# Patient Record
Sex: Male | Born: 1975
Health system: Southern US, Community
[De-identification: ages and names within clinical notes are randomized; demographics above are authoritative.]

## PROBLEM LIST (undated history)

## (undated) DIAGNOSIS — R569 Unspecified convulsions: Secondary | ICD-10-CM

## (undated) DIAGNOSIS — I10 Essential (primary) hypertension: Secondary | ICD-10-CM

## (undated) DIAGNOSIS — S069X9A Unspecified intracranial injury with loss of consciousness of unspecified duration, initial encounter: Secondary | ICD-10-CM

## (undated) HISTORY — PX: BRAIN SURGERY: SHX531

## (undated) HISTORY — DX: Essential (primary) hypertension: I10

---

## 1995-04-20 DIAGNOSIS — S069XAA Unspecified intracranial injury with loss of consciousness status unknown, initial encounter: Secondary | ICD-10-CM

## 1995-04-20 DIAGNOSIS — S069X9A Unspecified intracranial injury with loss of consciousness of unspecified duration, initial encounter: Secondary | ICD-10-CM

## 1995-04-20 HISTORY — DX: Unspecified intracranial injury with loss of consciousness status unknown, initial encounter: S06.9XAA

## 1995-04-20 HISTORY — DX: Unspecified intracranial injury with loss of consciousness of unspecified duration, initial encounter: S06.9X9A

## 1999-05-11 ENCOUNTER — Encounter: Payer: Self-pay | Admitting: Emergency Medicine

## 1999-05-11 ENCOUNTER — Emergency Department (HOSPITAL_COMMUNITY): Admission: EM | Admit: 1999-05-11 | Discharge: 1999-05-11 | Payer: Self-pay | Admitting: Emergency Medicine

## 1999-05-17 ENCOUNTER — Encounter: Payer: Self-pay | Admitting: Emergency Medicine

## 1999-05-17 ENCOUNTER — Emergency Department (HOSPITAL_COMMUNITY): Admission: EM | Admit: 1999-05-17 | Discharge: 1999-05-17 | Payer: Self-pay | Admitting: Emergency Medicine

## 2001-04-24 ENCOUNTER — Emergency Department (HOSPITAL_COMMUNITY): Admission: EM | Admit: 2001-04-24 | Discharge: 2001-04-24 | Payer: Self-pay | Admitting: Emergency Medicine

## 2001-05-09 ENCOUNTER — Emergency Department (HOSPITAL_COMMUNITY): Admission: EM | Admit: 2001-05-09 | Discharge: 2001-05-09 | Payer: Self-pay | Admitting: Emergency Medicine

## 2005-05-19 ENCOUNTER — Emergency Department (HOSPITAL_COMMUNITY): Admission: EM | Admit: 2005-05-19 | Discharge: 2005-05-19 | Payer: Self-pay | Admitting: Emergency Medicine

## 2006-04-13 ENCOUNTER — Inpatient Hospital Stay (HOSPITAL_COMMUNITY): Admission: AC | Admit: 2006-04-13 | Discharge: 2006-04-14 | Payer: Self-pay

## 2006-06-20 ENCOUNTER — Ambulatory Visit: Payer: Self-pay | Admitting: Family Medicine

## 2006-06-23 ENCOUNTER — Ambulatory Visit (HOSPITAL_COMMUNITY): Admission: RE | Admit: 2006-06-23 | Discharge: 2006-06-23 | Payer: Self-pay | Admitting: Internal Medicine

## 2006-08-18 ENCOUNTER — Ambulatory Visit (HOSPITAL_BASED_OUTPATIENT_CLINIC_OR_DEPARTMENT_OTHER): Admission: RE | Admit: 2006-08-18 | Discharge: 2006-08-18 | Payer: Self-pay | Admitting: General Surgery

## 2006-09-11 ENCOUNTER — Emergency Department (HOSPITAL_COMMUNITY): Admission: EM | Admit: 2006-09-11 | Discharge: 2006-09-12 | Payer: Self-pay | Admitting: Emergency Medicine

## 2006-10-29 ENCOUNTER — Emergency Department (HOSPITAL_COMMUNITY): Admission: EM | Admit: 2006-10-29 | Discharge: 2006-10-29 | Payer: Self-pay | Admitting: Emergency Medicine

## 2007-02-06 DIAGNOSIS — K4090 Unilateral inguinal hernia, without obstruction or gangrene, not specified as recurrent: Secondary | ICD-10-CM | POA: Insufficient documentation

## 2007-02-06 DIAGNOSIS — Z9889 Other specified postprocedural states: Secondary | ICD-10-CM | POA: Insufficient documentation

## 2008-06-13 ENCOUNTER — Emergency Department (HOSPITAL_COMMUNITY): Admission: EM | Admit: 2008-06-13 | Discharge: 2008-06-13 | Payer: Self-pay | Admitting: Family Medicine

## 2010-09-04 NOTE — Op Note (Signed)
Michael Oneill, Michael Oneill            ACCOUNT NO.:  [id number]   MEDICAL RECORD NO.:  [id number]          PATIENT TYPE:  AMB   LOCATION:  DSC                          FACILITY:  [facility]   PHYSICIAN:  Cherylynn Ridges, M.D.    DATE OF BIRTH:  1975/09/09   DATE OF PROCEDURE:  DATE OF DISCHARGE:                               OPERATIVE REPORT   PREOPERATIVE DIAGNOSIS:  Right inguinal hernia.   POSTOPERATIVE DIAGNOSIS:  Large right indirect inguinal hernia   PROCEDURE:  Right inguinal hernia repair with mesh.   SURGEON:  Cherylynn Ridges, M.D.   ASSISTANT:  None.   ANESTHESIA:  General with a laryngeal airway.   ESTIMATED BLOOD LOSS:  50 mL.   COMPLICATIONS:  None.   CONDITION:  Stable.   FINDINGS:  The patient had a large indirect sac associated with the  spermatic cord.  The cord was intact along with its blood some.  He had  a very small direct component.   INDICATIONS FOR OPERATION:  The patient is a 35 year old with a  symptomatic large right inguinal hernia.   OPERATION:  The patient was taken to the operating room, placed on the  table in supine position.  After an adequate general laryngeal airway  anesthetic was administered, he was prepped and draped in the usual  sterile manner exposing the right groin area.   A right transverse curvilinear incision was made at the level of the  superficial ring.  We took it down to and through the subcutaneous  tissue and then through Scarpa fascia down to the external oblique  fascia.  We dissected out the fascia, then the external oblique fascia  along its fibers down through the superficial ring.  We dissected the  spermatic cord out along with a large hernia sac at the pubic tubercle.  We isolated it up on a work bench with a Penrose drain and then  dissected away this large indirect sac away from the spermatic cord.  During this process because of the long-term process of this hernia,  there was a lot of vasculature to the sac  connected to the cord and as  we dissected this away, there was some bleeding of the cord structures.  Care was taken not to injure the vas deferens or its blood supply.   We dissected away the cord from the sac.  We twisted the sac and then  put a suture ligature x2 at its base and allowed to it retract after  resecting the excess sac.  We then repaired the floor where there was a  small indirect component by imbricating it on itself with a three  interrupted stitches of 0 Ethibond.  Ethibond 0 was also used to tie  off and ligate the sac.  We then sutured in an oval piece of mesh  measuring approximately 5 x 3 cm the size from the pubic tubercle up to  the internal ring using a running 0 Prolene, attaching mesh to the  anterior medial aspect of the conjoined tendon and inferolaterally to  the reflected portion of the inguinal ligament.  We tied  it down.  All  counts were correct.  The spermatic cord was allowed to fall back into  the canal.  We reapproximated the external oblique fascia on top of the  spermatic cord using a running 3-0 Vicryl suture.  We irrigated with  antibiotic solution.  We soaked the mesh in this solution also.  We  reapproximated Scarpa fascia using interrupted 3-0 Vicryl sutures and  the skin was closed using running subcuticular stitch of 4-0 Vicryl.  All needle, sponge counts and instrument counts were correct.  A sterile  dressing was applied.      Cherylynn Ridges, M.D.  Electronically Signed     JOW/MEDQ  D:  08/18/2006  T:  08/18/2006  Job:  [id number]

## 2013-03-01 ENCOUNTER — Encounter (HOSPITAL_COMMUNITY): Payer: Self-pay | Admitting: Emergency Medicine

## 2013-03-01 ENCOUNTER — Emergency Department (HOSPITAL_COMMUNITY)
Admission: EM | Admit: 2013-03-01 | Discharge: 2013-03-02 | Disposition: A | Discharge status: Home / Self Care | Payer: Self-pay | Attending: Emergency Medicine | Admitting: Emergency Medicine

## 2013-03-01 ENCOUNTER — Emergency Department (HOSPITAL_COMMUNITY): Payer: Self-pay

## 2013-03-01 DIAGNOSIS — S0181XA Laceration without foreign body of other part of head, initial encounter: Secondary | ICD-10-CM

## 2013-03-01 DIAGNOSIS — W19XXXA Unspecified fall, initial encounter: Secondary | ICD-10-CM

## 2013-03-01 DIAGNOSIS — Y9289 Other specified places as the place of occurrence of the external cause: Secondary | ICD-10-CM | POA: Insufficient documentation

## 2013-03-01 DIAGNOSIS — F17200 Nicotine dependence, unspecified, uncomplicated: Secondary | ICD-10-CM | POA: Insufficient documentation

## 2013-03-01 DIAGNOSIS — W010XXA Fall on same level from slipping, tripping and stumbling without subsequent striking against object, initial encounter: Secondary | ICD-10-CM | POA: Insufficient documentation

## 2013-03-01 DIAGNOSIS — S02609B Fracture of mandible, unspecified, initial encounter for open fracture: Secondary | ICD-10-CM | POA: Insufficient documentation

## 2013-03-01 DIAGNOSIS — Y9389 Activity, other specified: Secondary | ICD-10-CM | POA: Insufficient documentation

## 2013-03-01 DIAGNOSIS — W1809XA Striking against other object with subsequent fall, initial encounter: Secondary | ICD-10-CM | POA: Insufficient documentation

## 2013-03-01 DIAGNOSIS — F1010 Alcohol abuse, uncomplicated: Secondary | ICD-10-CM | POA: Insufficient documentation

## 2013-03-01 DIAGNOSIS — S0180XA Unspecified open wound of other part of head, initial encounter: Secondary | ICD-10-CM | POA: Insufficient documentation

## 2013-03-01 MED ORDER — SODIUM CHLORIDE 0.9 % IV BOLUS (SEPSIS)
2000.0000 mL | Freq: Once | INTRAVENOUS | Status: AC
  Administered 2013-03-02: 2000 mL via INTRAVENOUS

## 2013-03-01 NOTE — ED Provider Notes (Signed)
CSN: [id number]     Arrival date & time 03/01/13  2217 History   First MD Initiated Contact with Patient 03/01/13 2309     Chief Complaint  Patient presents with  . Laceration   (Consider location/radiation/quality/duration/timing/severity/associated sxs/prior Treatment) HPI  Patient intoxicated after drinking lots of beer. Shortly PTA, he tripped, fell, struck face chin first. Denies LOC. Able to get back up, walk to house and get help.   6/10, aching pain. Unable to rate or describe. Denies pain or injuries to any other region.   History reviewed. No pertinent past medical history. History reviewed. No pertinent past surgical history. History reviewed. No pertinent family history. History  Substance Use Topics  . Smoking status: Current Every Day Smoker  . Smokeless tobacco: Not on file  . Alcohol Use: Yes    Review of Systems 10 point review of systems obtained and is negative with the exception of symptoms noted above.  Allergies  Review of patient's allergies indicates no known allergies.  Home Medications  No current outpatient prescriptions on file. BP 127/74  Pulse 60  Temp(Src) 97.5 F (36.4 C) (Oral)  Resp 17  Wt 147 lb 7 oz (66.877 kg)  SpO2 95% Physical Exam Gen: well developed and well nourished appearing, disheveled appearing Head: NCAT Eyes: PERL, EOMI Nose: no epistaixis or rhinorrhea, no epistaxis Face:  Mouth/throat: mucosa is moist and pink, tooth #22 and #23 are both mobile, there is disruption of the gingiva at this site, no gaping lesions/lacerations. Mandible is tender Neck: supple, no c spine ttp Lungs: CTA B, no wheezing, rhonchi or rales CV: RRR, no murmur, good distal pulses.  Abd: soft, notender, nondistended Back: no midline ttp Skin: warm and dry Neuro: CN ii-xii grossly intact, no focal deficits Psyche; normal affect,  calm and cooperative.   ED Course  Procedures (including critical care time) Labs Review CT Maxillofacial WO  CM (Final result)  Result time: 03/02/13 00:57:47    Final result by Rad Results In Interface (03/02/13 00:57:47)    Narrative:   CLINICAL DATA: Status post fall; laceration to the chin, with pain.  EXAM: CT MAXILLOFACIAL WITHOUT CONTRAST  TECHNIQUE: Multidetector CT imaging of the maxillofacial structures was performed. Multiplanar CT image reconstructions were also generated. A small metallic BB was placed on the right temple in order to reliably differentiate right from left.  COMPARISON: None.  FINDINGS: There is an incomplete fracture involving the central mandible just to the left of midline, extending between the left lateral incisor and left canine. The inferior cortex appears intact. There is also a displaced oblique fracture through the left mandibular condylar head, extending into the lateral aspect of the left temporomandibular joint. In addition, there is vague linear lucency along the right mandibular condyle, which could reflect an incomplete fracture.  The maxilla appears intact. The nasal bone is unremarkable in appearance. Multiple dental caries are noted, the largest of which is seen at the right first maxillary molar. 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 A right-sided craniotomy flap is noted.  The orbits are intact bilaterally. Mucosal thickening is noted within the maxillary sinuses; the remaining visualized paranasal sinuses and mastoid air cells are well-aerated.  A soft tissue laceration is noted at the chin, with associated soft tissue swelling and minimal soft tissue air. The parapharyngeal fat planes are preserved. The nasopharynx, oropharynx and hypopharynx are unremarkable in appearance. The visualized portions of the valleculae and piriform sinuses are grossly unremarkable. The visualized portions  of the brain are unremarkable in appearance.  The parotid and submandibular glands are within normal limits.  No cervical lymphadenopathy is seen.  IMPRESSION: 1. Displaced oblique fracture through the left mandibular condylar head, extending into the lateral aspect of the left temporomandibular joint. 2. Incomplete fracture involving the central mandible just to the left of midline, extending between the left lateral incisor and left canine. The inferior cortex of the mandible appears intact. 3. Vague linear lucency along the right mandibular condyle, which could reflect an incomplete fracture. 4. Multiple dental caries, the largest of which is noted at the right first maxillary molar. 5. Mucosal thickening within the maxillary sinuses. 6. Soft tissue laceration at the chin, with associated soft tissue swelling and minimal soft tissue air.   Electronically Signed By: Roanna Raider M.D. On: 03/02/2013 00:57             DG Chest Port 1 View (Final result)  Result time: 03/01/13 23:53:06    Final result by Rad Results In Interface (03/01/13 23:53:06)    Narrative:   CLINICAL DATA: Facial and neck trauma.  EXAM: PORTABLE CHEST - 1 VIEW  COMPARISON: 09/11/2006.  FINDINGS: Normal sized heart. Clear lungs. Minimal scoliosis.  IMPRESSION: No acute abnormality.   Electronically Signed By: Gordan Payment M.D. On: 03/01/2013 23:53      MDM  Please note that this is a late entry:  Patient is s/p mechanical fall, potentiated by alcohol intoxication, which has resulted in open mandible fracture and laceration of the face. The laceration has been repaired by Ebbie Ridge, PA.  Shortly after receiving the results of the patient's CT maxillofacial series, I discussed the case with Dr. Jeanice Lim - oral surgeon on call. He will see the patient in the office later in the day. We will observe the patient until 8am and then make an appt for the patient to see Dr. Jeanice Lim later in the day. Patient treated with abx in ED. Advised to stay NPO until office appt.     Brandt Loosen, MD 03/03/13  [phone]

## 2013-03-01 NOTE — ED Notes (Signed)
Presents with laceration to chin, bleeding controlled. Pt admits to ETOH use and has slurred and jumbled speech, unable to get much information. Continues to repeat, "ya know what I am sayin" when asked a question.

## 2013-03-02 ENCOUNTER — Emergency Department (HOSPITAL_COMMUNITY): Payer: Self-pay

## 2013-03-02 MED ORDER — AMOXICILLIN 500 MG PO CAPS: 500.0000 mg | ORAL_CAPSULE | Freq: Three times a day (TID) | ORAL | Status: DC

## 2013-03-02 MED ORDER — HYDROCODONE-ACETAMINOPHEN 5-325 MG PO TABS: 20.0000 | ORAL_TABLET | ORAL | Status: DC | PRN

## 2013-03-02 MED ORDER — AMOXICILLIN 250 MG/5ML PO SUSR
750.0000 mg | Freq: Once | ORAL | Status: AC
  Administered 2013-03-02: 750 mg via ORAL
  Filled 2013-03-02: qty 15

## 2013-03-02 NOTE — ED Notes (Signed)
Pt returned from radiology.

## 2013-03-02 NOTE — ED Provider Notes (Signed)
LACERATION REPAIR Performed by: Carlyle Dolly Authorized by: Carlyle Dolly Consent: Verbal consent obtained. Risks and benefits: risks, benefits and alternatives were discussed Consent given by: patient Patient identity confirmed: provided demographic data Prepped and Draped in normal sterile fashion Wound explored  Laceration Location: Chin  Laceration Length: 3.1cm  No Foreign Bodies seen or palpated  Anesthesia: local infiltration  Local anesthetic: lidocaine 2% wo epinephrine  Anesthetic total: 7 ml  Irrigation method: syringe Amount of cleaning: standard  Skin closure: 5-0 Prolene  Number of sutures: 6  Technique: simple interrupted  Patient tolerance: Patient tolerated the procedure well with no immediate complications.   Carlyle Dolly, PA-C 03/02/13 [phone]

## 2013-03-02 NOTE — ED Notes (Addendum)
Dr. Jeanice Lim, DDS called per Dr. Lavella Lemons request to follow up with pt being able to be seen by Dr. Jeanice Lim today in office. Dr. Jeanice Lim stated he would see patient at 12:00 pm on 03/02/2013. Pt made aware.

## 2013-03-02 NOTE — ED Provider Notes (Signed)
Medical screening examination/treatment/procedure(s) were performed by non-physician practitioner and as supervising physician I was immediately available for consultation/collaboration.    Brandt Loosen, MD 03/02/13 [phone]

## 2015-05-26 ENCOUNTER — Emergency Department (HOSPITAL_COMMUNITY)
Admission: EM | Admit: 2015-05-26 | Discharge: 2015-05-26 | Disposition: A | Discharge status: Home / Self Care | Payer: Self-pay

## 2015-05-26 ENCOUNTER — Emergency Department (HOSPITAL_COMMUNITY)
Admission: EM | Admit: 2015-05-26 | Discharge: 2015-05-27 | Disposition: A | Discharge status: Home / Self Care | Payer: Self-pay | Attending: Emergency Medicine | Admitting: Emergency Medicine

## 2015-05-26 ENCOUNTER — Encounter (HOSPITAL_COMMUNITY): Payer: Self-pay

## 2015-05-26 DIAGNOSIS — R51 Headache: Secondary | ICD-10-CM | POA: Insufficient documentation

## 2015-05-26 DIAGNOSIS — F17200 Nicotine dependence, unspecified, uncomplicated: Secondary | ICD-10-CM | POA: Insufficient documentation

## 2015-05-26 HISTORY — DX: Unspecified intracranial injury with loss of consciousness of unspecified duration, initial encounter: S06.9X9A

## 2015-05-26 NOTE — ED Notes (Signed)
Pt here with c/o right sided HA and right eye pain, onset a week ago. Hx of brain injury in 1997. Denies recent injury to head. No weakness noted at triage. Pt A&OX4, ambulatory with steady gait. Denies vision changes.

## 2015-05-27 NOTE — ED Notes (Signed)
Pt called for vital sign reassessment - no answer.

## 2015-05-27 NOTE — ED Notes (Signed)
Pt called for vital sign reassessment - no answer x 2.

## 2015-05-31 ENCOUNTER — Emergency Department (HOSPITAL_COMMUNITY): Payer: Self-pay

## 2015-05-31 ENCOUNTER — Encounter (HOSPITAL_COMMUNITY): Payer: Self-pay | Admitting: Family Medicine

## 2015-05-31 ENCOUNTER — Inpatient Hospital Stay (HOSPITAL_COMMUNITY)
Admission: EM | Admit: 2015-05-31 | Discharge: 2015-06-02 | DRG: 087 | Disposition: A | Discharge status: Home / Self Care | Payer: Self-pay | Attending: Neurosurgery | Admitting: Neurosurgery

## 2015-05-31 DIAGNOSIS — R402252 Coma scale, best verbal response, oriented, at arrival to emergency department: Secondary | ICD-10-CM | POA: Diagnosis present

## 2015-05-31 DIAGNOSIS — S065X9A Traumatic subdural hemorrhage with loss of consciousness of unspecified duration, initial encounter: Secondary | ICD-10-CM | POA: Diagnosis present

## 2015-05-31 DIAGNOSIS — Y929 Unspecified place or not applicable: Secondary | ICD-10-CM

## 2015-05-31 DIAGNOSIS — R402362 Coma scale, best motor response, obeys commands, at arrival to emergency department: Secondary | ICD-10-CM | POA: Diagnosis present

## 2015-05-31 DIAGNOSIS — R51 Headache: Secondary | ICD-10-CM | POA: Diagnosis present

## 2015-05-31 DIAGNOSIS — S065XAA Traumatic subdural hemorrhage with loss of consciousness status unknown, initial encounter: Secondary | ICD-10-CM | POA: Diagnosis present

## 2015-05-31 DIAGNOSIS — F17210 Nicotine dependence, cigarettes, uncomplicated: Secondary | ICD-10-CM | POA: Diagnosis present

## 2015-05-31 DIAGNOSIS — R402142 Coma scale, eyes open, spontaneous, at arrival to emergency department: Secondary | ICD-10-CM | POA: Diagnosis present

## 2015-05-31 DIAGNOSIS — S065X0A Traumatic subdural hemorrhage without loss of consciousness, initial encounter: Principal | ICD-10-CM | POA: Diagnosis present

## 2015-05-31 DIAGNOSIS — F1490 Cocaine use, unspecified, uncomplicated: Secondary | ICD-10-CM | POA: Diagnosis present

## 2015-05-31 LAB — COMPREHENSIVE METABOLIC PANEL
ALT: 16 U/L — ABNORMAL LOW (ref 17–63)
AST: 30 U/L (ref 15–41)
Albumin: 4.2 g/dL (ref 3.5–5.0)
Alkaline Phosphatase: 71 U/L (ref 38–126)
Anion gap: 11 (ref 5–15)
BUN: <5 mg/dL — ABNORMAL LOW (ref 6–20)
CO2: 25 mmol/L (ref 22–32)
Calcium: 9.2 mg/dL (ref 8.9–10.3)
Chloride: 100 mmol/L — ABNORMAL LOW (ref 101–111)
Creatinine, Ser: 1.03 mg/dL (ref 0.61–1.24)
GFR calc Af Amer: >60 mL/min (ref >60)
GFR calc non Af Amer: >60 mL/min (ref >60)
Glucose, Bld: 99 mg/dL (ref 65–99)
Potassium: 4.5 mmol/L (ref 3.5–5.1)
Sodium: 136 mmol/L (ref 135–145)
Total Bilirubin: 0.6 mg/dL (ref 0.3–1.2)
Total Protein: 7 g/dL (ref 6.5–8.1)

## 2015-05-31 LAB — I-STAT CHEM 8, ED
BUN: 6 mg/dL (ref 6–20)
Calcium, Ion: 1.1 mmol/L — ABNORMAL LOW (ref 1.12–1.23)
Chloride: 99 mmol/L — ABNORMAL LOW (ref 101–111)
Creatinine, Ser: 1.2 mg/dL (ref 0.61–1.24)
Glucose, Bld: 98 mg/dL (ref 65–99)
HCT: 44 % (ref 39.0–52.0)
Hemoglobin: 15 g/dL (ref 13.0–17.0)
Potassium: 4.2 mmol/L (ref 3.5–5.1)
Sodium: 137 mmol/L (ref 135–145)
TCO2: 27 mmol/L (ref 0–100)

## 2015-05-31 LAB — CBC
HCT: 38.1 % — ABNORMAL LOW (ref 39.0–52.0)
Hemoglobin: 13.1 g/dL (ref 13.0–17.0)
MCH: 32.2 pg (ref 26.0–34.0)
MCHC: 34.4 g/dL (ref 30.0–36.0)
MCV: 93.6 fL (ref 78.0–100.0)
Platelets: 258 10*3/uL (ref 150–400)
RBC: 4.07 MIL/uL — ABNORMAL LOW (ref 4.22–5.81)
RDW: 12.9 % (ref 11.5–15.5)
WBC: 7 10*3/uL (ref 4.0–10.5)

## 2015-05-31 LAB — MRSA PCR SCREENING: MRSA by PCR: NEGATIVE

## 2015-05-31 LAB — DIFFERENTIAL
Basophils Absolute: 0 10*3/uL (ref 0.0–0.1)
Basophils Relative: 1 %
Eosinophils Absolute: 0.1 10*3/uL (ref 0.0–0.7)
Eosinophils Relative: 1 %
Lymphocytes Relative: 36 %
Lymphs Abs: 2.5 10*3/uL (ref 0.7–4.0)
Monocytes Absolute: 0.8 10*3/uL (ref 0.1–1.0)
Monocytes Relative: 12 %
Neutro Abs: 3.5 10*3/uL (ref 1.7–7.7)
Neutrophils Relative %: 50 %

## 2015-05-31 LAB — PROTIME-INR
INR: 1.02 (ref 0.00–1.49)
Prothrombin Time: 13.6 seconds (ref 11.6–15.2)

## 2015-05-31 LAB — I-STAT TROPONIN, ED: Troponin i, poc: 0 ng/mL (ref 0.00–0.08)

## 2015-05-31 LAB — APTT: aPTT: 28 seconds (ref 24–37)

## 2015-05-31 MED ORDER — PANTOPRAZOLE SODIUM 40 MG PO TBEC
40.0000 mg | DELAYED_RELEASE_TABLET | Freq: Every day | ORAL | Status: DC
  Administered 2015-06-01 – 2015-06-02 (×2): 40 mg via ORAL
  Filled 2015-05-31 (×2): qty 1

## 2015-05-31 MED ORDER — PANTOPRAZOLE SODIUM 40 MG IV SOLR: 40.0000 mg | Freq: Every day | INTRAVENOUS | Status: DC

## 2015-05-31 MED ORDER — ONDANSETRON HCL 4 MG/2ML IJ SOLN: 4.0000 mg | Freq: Four times a day (QID) | INTRAMUSCULAR | Status: DC | PRN

## 2015-05-31 MED ORDER — ONDANSETRON HCL 4 MG PO TABS: 4.0000 mg | ORAL_TABLET | Freq: Four times a day (QID) | ORAL | Status: DC | PRN

## 2015-05-31 MED ORDER — SODIUM CHLORIDE 0.9 % IV SOLN
INTRAVENOUS | Status: DC
Start: 2015-05-31 — End: 2015-06-01
  Administered 2015-05-31 – 2015-06-01 (×2): via INTRAVENOUS

## 2015-05-31 MED ORDER — FENTANYL CITRATE (PF) 100 MCG/2ML IJ SOLN
50.0000 ug | Freq: Once | INTRAMUSCULAR | Status: AC
  Administered 2015-05-31: 50 ug via INTRAVENOUS
  Filled 2015-05-31: qty 2

## 2015-05-31 MED ORDER — HYDROMORPHONE HCL 1 MG/ML IJ SOLN: 1.0000 mg | INTRAMUSCULAR | Status: DC | PRN

## 2015-05-31 MED ORDER — HYDROCODONE-ACETAMINOPHEN 5-325 MG PO TABS
1.0000 | ORAL_TABLET | ORAL | Status: DC | PRN
  Administered 2015-05-31 – 2015-06-01 (×7): 2 via ORAL
  Administered 2015-06-02: 1 via ORAL
  Administered 2015-06-02 (×2): 2 via ORAL
  Filled 2015-05-31 (×10): qty 2

## 2015-05-31 NOTE — ED Notes (Addendum)
Pt here for headache over the past week on the right side of his head where he has an old TBI. sts also he woke up this am and left thigh and right side of face numb. Denies arm numbness. sts taking ibuprofen for the pain without relief. sts also some blurred vision.

## 2015-05-31 NOTE — H&P (Signed)
Michael Oneill is an 40 y.o. male.   Chief Complaint: Headache HPI: 40 year old male status post previous right-sided craniotomy for evacuation of subdural hematoma approximately 20 years ago. Patient presents now with a history of progressive headache with some left sided numbness and subjective weakness. Patient reports that he did hit his head a couple days ago. No loss of cost and is associated with this. Patient is not taking anticoagulants. No history of seizure. No history of other constitutional symptoms.  Past Medical History  Diagnosis Date  . TBI (traumatic brain injury) ([place]) 1997    Past Surgical History  Procedure Laterality Date  . Brain surgery      History reviewed. No pertinent family history. Social History:  reports that he has been smoking.  He does not have any smokeless tobacco history on file. He reports that he drinks alcohol. He reports that he does not use illicit drugs.  Allergies: No Known Allergies   (Not in a hospital admission)  Results for orders placed or performed during the hospital encounter of 05/31/15 (from the past 48 hour(s))  Protime-INR     Status: None   Collection Time: 05/31/15 11:37 AM  Result Value Ref Range   Prothrombin Time 13.6 11.6 - 15.2 seconds   INR 1.02 0.00 - 1.49  APTT     Status: None   Collection Time: 05/31/15 11:37 AM  Result Value Ref Range   aPTT 28 24 - 37 seconds  CBC     Status: Abnormal   Collection Time: 05/31/15 11:37 AM  Result Value Ref Range   WBC 7.0 4.0 - 10.5 K/uL   RBC 4.07 (L) 4.22 - 5.81 MIL/uL   Hemoglobin 13.1 13.0 - 17.0 g/dL   HCT 38.1 (L) 39.0 - 52.0 %   MCV 93.6 78.0 - 100.0 fL   MCH 32.2 26.0 - 34.0 pg   MCHC 34.4 30.0 - 36.0 g/dL   RDW 12.9 11.5 - 15.5 %   Platelets 258 150 - 400 K/uL  Differential     Status: None   Collection Time: 05/31/15 11:37 AM  Result Value Ref Range   Neutrophils Relative % 50 %   Neutro Abs 3.5 1.7 - 7.7 K/uL   Lymphocytes Relative 36 %   Lymphs Abs  2.5 0.7 - 4.0 K/uL   Monocytes Relative 12 %   Monocytes Absolute 0.8 0.1 - 1.0 K/uL   Eosinophils Relative 1 %   Eosinophils Absolute 0.1 0.0 - 0.7 K/uL   Basophils Relative 1 %   Basophils Absolute 0.0 0.0 - 0.1 K/uL  Comprehensive metabolic panel     Status: Abnormal   Collection Time: 05/31/15 11:37 AM  Result Value Ref Range   Sodium 136 135 - 145 mmol/L   Potassium 4.5 3.5 - 5.1 mmol/L   Chloride 100 (L) 101 - 111 mmol/L   CO2 25 22 - 32 mmol/L   Glucose, Bld 99 65 - 99 mg/dL   BUN <5 (L) 6 - 20 mg/dL   Creatinine, Ser 1.03 0.61 - 1.24 mg/dL   Calcium 9.2 8.9 - 10.3 mg/dL   Total Protein 7.0 6.5 - 8.1 g/dL   Albumin 4.2 3.5 - 5.0 g/dL   AST 30 15 - 41 U/L   ALT 16 (L) 17 - 63 U/L   Alkaline Phosphatase 71 38 - 126 U/L   Total Bilirubin 0.6 0.3 - 1.2 mg/dL   GFR calc non Af Amer >60 >60 mL/min   GFR calc Af Amer >  60 >60 mL/min    Comment: (NOTE) The eGFR has been calculated using the CKD EPI equation. This calculation has not been validated in all clinical situations. eGFR's persistently <60 mL/min signify possible Chronic Kidney Disease.    Anion gap 11 5 - 15  I-stat troponin, ED (not at [facility], [facility])     Status: None   Collection Time: 05/31/15 11:37 AM  Result Value Ref Range   Troponin i, poc 0.00 0.00 - 0.08 ng/mL   Comment 3            Comment: Due to the release kinetics of cTnI, a negative result within the first hours of the onset of symptoms does not rule out myocardial infarction with certainty. If myocardial infarction is still suspected, repeat the test at appropriate intervals.   I-Stat Chem 8, ED  (not at [facility], [facility])     Status: Abnormal   Collection Time: 05/31/15 11:39 AM  Result Value Ref Range   Sodium 137 135 - 145 mmol/L   Potassium 4.2 3.5 - 5.1 mmol/L   Chloride 99 (L) 101 - 111 mmol/L   BUN 6 6 - 20 mg/dL   Creatinine, Ser 1.20 0.61 - 1.24 mg/dL   Glucose, Bld 98 65 - 99 mg/dL   Calcium, Ion 1.10 (L) 1.12 - 1.23 mmol/L   TCO2 27 0 - 100  mmol/L   Hemoglobin 15.0 13.0 - 17.0 g/dL   HCT 44.0 39.0 - 52.0 %   Ct Head Wo Contrast  05/31/2015  CLINICAL DATA:  Right-sided headache for 1 week with facial numbness and blurred vision. EXAM: CT HEAD WITHOUT CONTRAST TECHNIQUE: Contiguous axial images were obtained from the base of the skull through the vertex without intravenous contrast. COMPARISON:  Report of CT head without contrast 05/11/1999. FINDINGS: An acute right extra-axial fluid collection is present. This extends over the right convexity. Areas of hyperdense bladder most evident adjacent to the right parietal and temporal lobe extending into the right middle cranial fossa and more anteriorly over the anterior right frontal convexity. There is mass effect with effacement of the sulci and crowding of the cortex. Midline shift measures 6 mm at the foramen of Monro. No cortical infarct is present. There is no left-sided hemorrhage. There is partial effacement of the right lateral ventricle. Posterior fossa structures are intact. A remote right-sided craniotomy is again noted. The calvarium is otherwise intact. No significant extracranial soft tissue lesions are present. The globes and orbits are within normal limits. The paranasal sinuses and mastoid air cells are clear. IMPRESSION: 1. Acute/subacute right subdural hematoma with mass effect and midline shift as described. The maximum width of the hemorrhage is 8 mm. 2. 6 mm midline shift at the foramen of Monro. These results were called by telephone at the time of interpretation on 05/31/2015 at 2:45 PM to Dr. Dalia Heading , who verbally acknowledged these results. Electronically Signed   By: San Morelle M.D.   On: 05/31/2015 15:03    A comprehensive review of systems was negative.  Blood pressure 128/89, pulse 73, temperature 98 F (36.7 C), temperature source Oral, resp. rate 16, SpO2 100 %.  The patient is awake and alert. He is oriented and appropriate. His speech is  fluent. He appears nontoxic. Judgment and insight are intact. Cranial nerve function is intact bilaterally. Motor examination 5/5 with no evidence of pronator drift. Sensory examination with vague sensory loss in distal left upper extremity and left lower extremity. Reflexes somewhat brisk on the  left otherwise normal. Previous craniotomy wound well-healed. No evidence of trauma. No evidence of other bony abnormality of the skull. Oropharynx nasopharynx and external auditory canals clear. Neck supple. Chest and abdomen benign. Extremities free from injury or deformity. Assessment/Plan Right posterior temporal/tentorial subdural hematoma with minimal mass effect. Plan admission to ICU for observation. Follow-up head CT scan tomorrow.  Michael Oneill A 05/31/2015, 3:24 PM

## 2015-05-31 NOTE — ED Provider Notes (Signed)
CSN: [id number]     Arrival date & time 05/31/15  1102 History   First MD Initiated Contact with Patient 05/31/15 1203     Chief Complaint  Patient presents with  . Headache  . Numbness     (Consider location/radiation/quality/duration/timing/severity/associated sxs/prior Treatment) Patient is a 40 y.o. male presenting with headaches.  Headache  Patient presents to the Emergency Department complaining of a headache. The patient has a PMH significant for a TBI. Patient has a history of drug abuse and drinks a 6 pack of alcohol a day, he smokes 5 cigarettes a day, and uses cocaine occasionally. The headache has been present for 2 weeks and is intermittent. The patient states that he came to the ED today because the headache did not go away. The patient complains of photophobia, blurred vision in his R eye, and occasional phonophobia. He describes the headache as throbbing, pressure, and sharp shooting pain over his R temporal area. Patient also woke up this morning and noticed a transient L thigh numbness and associated weakness, and pain and numbness to his right sided face. Patient has tried ibuprofen for the headache and states that he has not had any relief. He admits to having chills, lightheadedness, dizziness, diarrhea. He denies tinnitus, nausea, vomiting, abdominal pain, chest pain, SOB, dysuria, hematuria, hematochezia.  Past Medical History  Diagnosis Date  . TBI (traumatic brain injury) (HCC) 1997   Past Surgical History  Procedure Laterality Date  . Brain surgery     History reviewed. No pertinent family history. Social History  Substance Use Topics  . Smoking status: Current Every Day Smoker  . Smokeless tobacco: None  . Alcohol Use: Yes    Review of Systems  Neurological: Positive for headaches.   All other systems negative except as documented in the HPI. All pertinent positives and negatives as reviewed in the HPI.    Allergies  Review of patient's allergies  indicates no known allergies.  Home Medications   Prior to Admission medications   Medication Sig Start Date End Date Taking? Authorizing Provider  amoxicillin (AMOXIL) 500 MG capsule Take 1 capsule (500 mg total) by mouth 3 (three) times daily. 03/02/13   Brandt Loosen, MD  HYDROcodone-acetaminophen (NORCO/VICODIN) 5-325 MG per tablet Take 20 tablets by mouth every 4 (four) hours as needed. 03/02/13   Brandt Loosen, MD   BP 124/91 mmHg  Pulse 85  Temp(Src) 98 F (36.7 C) (Oral)  Resp 18  SpO2 100% Physical Exam  Constitutional: He appears well-developed and well-nourished. He appears lethargic.  HENT:  Head: Normocephalic and atraumatic.  Neck: Normal range of motion. Neck supple.  Cardiovascular: Normal rate, regular rhythm and normal heart sounds.   Pulmonary/Chest: Effort normal and breath sounds normal. No respiratory distress. He has no wheezes. He has no rales. He exhibits no tenderness.  Abdominal: Soft. Bowel sounds are normal. He exhibits no distension. There is no tenderness. There is no rebound and no guarding.  Musculoskeletal: Normal range of motion.  Neurological: He has normal reflexes. He appears lethargic. He displays atrophy (LLE). A sensory deficit is present. He displays a negative Romberg sign.  Reflex Scores:      Tricep reflexes are 2+ on the right side and 2+ on the left side.      Bicep reflexes are 2+ on the right side and 2+ on the left side.      Brachioradialis reflexes are 2+ on the right side and 2+ on the left side.  Patellar reflexes are 2+ on the right side and 2+ on the left side.      Achilles reflexes are 2+ on the right side and 2+ on the left side. Patient has 5/5 strength in RLE, but 4/5 strength in LLE (decreased strength with dorsiflexion and extension of L foot, flexion and extension of L knee, abd/add and flexion and extension of L hip) No pronator drift    ED Course  Procedures (including critical care time) Labs Review Labs Reviewed   CBC - Abnormal; Notable for the following:    RBC 4.07 (*)    HCT 38.1 (*)    All other components within normal limits  COMPREHENSIVE METABOLIC PANEL - Abnormal; Notable for the following:    Chloride 100 (*)    BUN <5 (*)    ALT 16 (*)    All other components within normal limits  I-STAT CHEM 8, ED - Abnormal; Notable for the following:    Chloride 99 (*)    Calcium, Ion 1.10 (*)    All other components within normal limits  MRSA PCR SCREENING  PROTIME-INR  APTT  DIFFERENTIAL  I-STAT TROPOININ, ED    Imaging Review Ct Head Without Contrast  06/01/2015  CLINICAL DATA:  Follow-up subdural hematoma.  Initial encounter. EXAM: CT HEAD WITHOUT CONTRAST TECHNIQUE: Contiguous axial images were obtained from the base of the skull through the vertex without intravenous contrast. COMPARISON:  CT of the head performed 05/31/2015 FINDINGS: The patient's acute right-sided subdural hematoma is relatively stable in size, measuring 1.0 cm anteriorly and 1.2 cm along the posterior aspect of the right temporal lobe. Underlying subacute hematoma is seen. Approximately 6 mm of leftward midline shift is seen, relatively stable in appearance. The brainstem and fourth ventricle are within normal limits. The basal ganglia are unremarkable in appearance. Slight asymmetric prominence of the left lateral ventricle likely reflects mild mass effect on the right lateral ventricle, without definite evidence of hydrocephalus. There is no evidence of fracture; right-sided postoperative change is noted. The visualized portions of the orbits are within normal limits. The paranasal sinuses and mastoid air cells are well-aerated. No significant soft tissue abnormalities are seen. IMPRESSION: 1. Acute right-sided subdural hematoma is relatively stable, measuring 1.0 cm anteriorly and 1.2 cm along the posterior aspect of the right temporal lobe. Approximately 6 mm of leftward midline shift again noted. 2. Mild mass-effect on the  right lateral ventricle likely explains slight asymmetric prominence of the left lateral ventricle, without definite evidence of hydrocephalus. Electronically Signed   By: Roanna Raider M.D.   On: 06/01/2015 06:23   Ct Head Wo Contrast  05/31/2015  CLINICAL DATA:  Right-sided headache for 1 week with facial numbness and blurred vision. EXAM: CT HEAD WITHOUT CONTRAST TECHNIQUE: Contiguous axial images were obtained from the base of the skull through the vertex without intravenous contrast. COMPARISON:  Report of CT head without contrast 05/11/1999. FINDINGS: An acute right extra-axial fluid collection is present. This extends over the right convexity. Areas of hyperdense bladder most evident adjacent to the right parietal and temporal lobe extending into the right middle cranial fossa and more anteriorly over the anterior right frontal convexity. There is mass effect with effacement of the sulci and crowding of the cortex. Midline shift measures 6 mm at the foramen of Monro. No cortical infarct is present. There is no left-sided hemorrhage. There is partial effacement of the right lateral ventricle. Posterior fossa structures are intact. A remote right-sided craniotomy is again noted.  The calvarium is otherwise intact. No significant extracranial soft tissue lesions are present. The globes and orbits are within normal limits. The paranasal sinuses and mastoid air cells are clear. IMPRESSION: 1. Acute/subacute right subdural hematoma with mass effect and midline shift as described. The maximum width of the hemorrhage is 8 mm. 2. 6 mm midline shift at the foramen of Monro. These results were called by telephone at the time of interpretation on 05/31/2015 at 2:45 PM to Dr. Charlestine Night , who verbally acknowledged these results. Electronically Signed   By: Marin Roberts M.D.   On: 05/31/2015 15:03   I have personally reviewed and evaluated these images and lab results as part of my medical  decision-making.   EKG Interpretation None     1328 patient resting in bed, states that headache is 10/10 pain, no improvement.   MDM   Final diagnoses:  None    Patient will be admitted to the hospital for subdural hematoma.  I spoke with neurosurgery who will evaluate the patient for admission.  The patient did finally state that he may have fallen a few weeks ago.  This was well after we spoke with neurosurgery   Charlestine Night, PA-C 06/01/15 1540  Alvira Monday, MD 06/02/15 1656

## 2015-06-01 ENCOUNTER — Inpatient Hospital Stay (HOSPITAL_COMMUNITY): Payer: Self-pay

## 2015-06-01 NOTE — Progress Notes (Signed)
Overall feels better. Headache better controlled. No symptoms of numbness or weakness.  Afebrile. Vitals are stable. Awake and alert. Oriented and appropriate. Motor and sensory function intact. Left numbness that was present yesterday is not noticeable today.  Follow-up head CT scan demonstrates stable convexity/tentorial subdural hematoma of mixed density. There is moderate residual right to left midline shift of a proximally 6 mm. There is no evidence of transtentorial herniation.  Overall stable. Transfer to floor and begin to mobilize. No indications for evacuation hematoma at present.

## 2015-06-02 MED ORDER — HYDROCODONE-ACETAMINOPHEN 5-325 MG PO TABS: 1.0000 | ORAL_TABLET | Freq: Four times a day (QID) | ORAL | Status: DC | PRN

## 2015-06-02 NOTE — Care Management Note (Signed)
Case Management Note  Patient Details  Name: Michael Oneill MRN: [id number] Date of Birth: [birth year]  Subjective/Objective:                    Action/Plan: Patient was admitted with headache, subdural hematoma. Lives at home with mother. Patient is currently listed as self-pay and is being followed by Angelique Blonder in [employer].  CM will follow for discharge needs.  Expected Discharge Date:                  Expected Discharge Plan:     In-House Referral:     Discharge planning Services     Post Acute Care Choice:    Choice offered to:     DME Arranged:    DME Agency:     HH Arranged:    HH Agency:     Status of Service:  In process, will continue to follow  Medicare Important Message Given:    Date Medicare IM Given:    Medicare IM give by:    Date Additional Medicare IM Given:    Additional Medicare Important Message give by:     If discussed at Long Length of Stay Meetings, dates discussed:    Additional Comments:  Anda Kraft, RN 06/02/2015, 11:49 AM [phone]

## 2015-06-02 NOTE — Progress Notes (Addendum)
Discharge orders received.  Discharge instructions and follow-up appointments reviewed with the patient.  VSS upon discharge.  IV removed and education complete.  All belongings sent with the patient.  Transported out via wheelchair. Daliyah Sramek M, RN   

## 2015-06-02 NOTE — Discharge Summary (Signed)
  Physician Discharge Summary  Patient ID: Michael Oneill MRN: [id number] DOB/AGE: 1975/05/22 40 y.o.  Admit date: 05/31/2015 Discharge date: 06/02/2015  Admission Diagnoses:  Discharge Diagnoses:  Active Problems:   Subdural hematoma [facility])   Discharged Condition: good  Hospital Course: Patient admitted to the hospital for evaluation of right-sided subacute subdural hematoma. Patient observed in the ICU and follow-up head CT scan was obtained in the morning which demonstrated stable appearance of the subacute subdural hematoma. There is mild mass effect present. The patient has symptoms of headache but no other neurologic symptoms present. Plan is for discharge home with close follow-up as an outpatient. Symptoms may eventually require burr hole evacuation although I'm very hopeful this will not be necessary.  Consults:   Significant Diagnostic Studies:   Treatments:   Discharge Exam: Blood pressure 152/85, pulse 53, temperature 99.5 F (37.5 C), temperature source Oral, resp. rate 18, height  (1.702 m), weight 64.6 kg (142 lb 6.7 oz), SpO2 100 %. Awake and alert. Oriented and appropriate. Cranial nerve function intact. Motor and sensory function of the extremities normal. Chest and abdomen benign.  Disposition: 01-Home or Self Care     Medication List    TAKE these medications        HYDROcodone-acetaminophen 5-325 MG tablet  Commonly known as:  NORCO/VICODIN  Take 1-2 tablets by mouth every 6 (six) hours as needed for moderate pain.     ibuprofen 200 MG tablet  Commonly known as:  ADVIL,MOTRIN  Take 400 mg by mouth every 6 (six) hours as needed.         Signed: Ovida Delagarza A 06/02/2015, 2:28 PM

## 2015-06-02 NOTE — Care Management Note (Signed)
Case Management Note  Patient Details  Name: Michael Oneill MRN: [id number] Date of Birth: 03-25-1976  Subjective/Objective:                    Action/Plan: Patient discharging home with self care. CM met with the patient to see if he was interested in going to the [facility]. Patient states he is already signed up with them and has his first appointment in the near future. CM reminded him to use the pharmacy for his discharge medications. Bedside RN updated.   Expected Discharge Date:                  Expected Discharge Plan:  Home/Self Care  In-House Referral:     Discharge planning Services     Post Acute Care Choice:    Choice offered to:     DME Arranged:    DME Agency:     HH Arranged:    [place] Agency:     Status of Service:  Completed, signed off  Medicare Important Message Given:    Date Medicare IM Given:    Medicare IM give by:    Date Additional Medicare IM Given:    Additional Medicare Important Message give by:     If discussed at [place] of Stay Meetings, dates discussed:    Additional Comments:  Pollie Friar, RN 06/02/2015, 3:39 PM

## 2015-06-02 NOTE — Progress Notes (Signed)
Pt refused to get out of bed to ambulate.  Will continue to monitor. Sondra Come, RN

## 2015-06-02 NOTE — Discharge Instructions (Signed)
Subdural Hematoma  A subdural hematoma is a collection of blood between the brain and its tough outermost membrane covering (the dura).  Blood clots that form in this area push down on the brain and cause irritation. A subdural hematoma may cause parts of the brain to stop working and eventually cause death.   CAUSES  A subdural hematoma is caused by bleeding from a ruptured blood vessel (hemorrhage). The bleeding results from trauma to the head, such as from a fall or motor vehicle accident.  There are two types of subdural hemorrhages:  · Acute. This type develops shortly after a serious blow to the head and causes blood to collect very quickly. If not diagnosed and treated promptly, severe brain injury or death can occur.  · Chronic. This is when bleeding develops more slowly, over weeks or months.  RISK FACTORS  People at risk for subdural hematoma include older persons, infants, and alcoholics.  SYMPTOMS  An acute subdural hemorrhage develops over minutes to hours. Symptoms can include:  · Temporary loss of consciousness.  · Weakness of arms or legs on one side of the body.  · Changes in vision or speech.  · A severe headache.  · Seizures.  · Nausea and vomiting.  · Increased sleepiness.  A chronic subdural hemorrhage develops over weeks to months. Symptoms may develop slowly and produce less noticeable problems or changes. Symptoms include:  · A mild headache.  · A change in personality.  · Loss of balance or difficulty walking.  · Weakness, numbness, or tingling in the arms or legs.  · Nausea or vomiting.  · Memory loss.  · Double vision.  · Increased sleepiness.  DIAGNOSIS  Your health care provider will perform a thorough physical and neurological exam. A CT scan or MRI may also be done. If there is blood on the scan, its color will help your health care provider determine how long the hemorrhage has been there.  TREATMENT  If the cause is an acute subdural hemorrhage, immediate treatment is needed. In many  cases an emergency surgery is performed to drain accumulated blood or to remove the blood clot. Sometimes steroid or diuretic medicines or controlled breathing through a ventilator is needed to decrease pressure in the brain. This is especially true if there is any swelling of the brain.  If the cause is a chronic subdural hemorrhage, treatment depends on a variety of factors. Sometimes no treatment is needed. If the subdural hematoma is small and causes minimal or no symptoms, you may be treated with bed rest, medicines, and observation. If the hemorrhage is large or if you have neurological symptoms, an emergency surgery is usually needed to remove the blood clot.  People who develop a subdural hemorrhage are at risk of developing seizures, even after the subdural hematoma has been treated. You may be prescribed an anti-seizure (anticonvulsant) medicine for a year or longer.  HOME CARE INSTRUCTIONS  · Only take medicines as directed by your health care provider.  · Rest if directed by your health care provider.  · Keep all follow-up appointments with your health care provider.  · If you play a contact sport such as football, hockey or soccer and you experienced a significant head injury, allow enough time for healing (up to 15 days) before you start playing again. A repeated injury that occurs during this fragile repair period is likely to result in hemorrhage. This is called the second impact syndrome.  SEEK IMMEDIATE MEDICAL CARE IF:  ·   You fall or experience minor trauma to your head and you are taking blood thinners. If you are on any blood thinners even a very small injury can cause a subdural hematoma. You should not hesitate to seek medical attention regardless of how minor you think your symptoms are.  · You experience a head injury and have:    Drowsiness or a decrease in alertness.    Confusion or forgetfulness.    Slurred speech.    Irrational or aggressive behavior.    Numbness or paralysis in any part  of the body.    A feeling of being sick to your stomach (nauseous) or you throw up (vomit).    Difficulty walking or poor coordination.    Double vision.    Seizures.    A bleeding disorder.    A history of heavy alcohol use.    Clear fluid draining from your nose or ears.    Personality changes.    Difficulty thinking.    Worsening symptoms.  MAKE SURE YOU:  · Understand these instructions.  · Will watch your condition.  · Will get help right away if you are not doing well or get worse.  FOR MORE INFORMATION  National Institute of Neurological Disorders and Stroke: www.ninds.nih.gov  American Association of Neurological Surgeons: www.neurosurgerytoday.org  American Academy of Neurology (AAN): www.aan.com  Brain Injury Association of America: www.biausa.org     This information is not intended to replace advice given to you by your health care provider. Make sure you discuss any questions you have with your health care provider.     Document Released: 02/21/2004 Document Revised: 01/24/2013 Document Reviewed: 10/06/2012  Elsevier Interactive Patient Education ©2016 Elsevier Inc.

## 2015-06-03 ENCOUNTER — Emergency Department (HOSPITAL_COMMUNITY): Payer: Medicaid Other

## 2015-06-03 ENCOUNTER — Inpatient Hospital Stay (HOSPITAL_COMMUNITY)
Admission: EM | Admit: 2015-06-03 | Discharge: 2015-06-06 | DRG: 091 | Disposition: A | Discharge status: Home / Self Care | Payer: Medicaid Other | Attending: Internal Medicine | Admitting: Internal Medicine

## 2015-06-03 ENCOUNTER — Encounter (HOSPITAL_COMMUNITY): Payer: Self-pay | Admitting: *Deleted

## 2015-06-03 ENCOUNTER — Inpatient Hospital Stay (HOSPITAL_COMMUNITY): Payer: Medicaid Other

## 2015-06-03 DIAGNOSIS — I6200 Nontraumatic subdural hemorrhage, unspecified: Secondary | ICD-10-CM

## 2015-06-03 DIAGNOSIS — I248 Other forms of acute ischemic heart disease: Secondary | ICD-10-CM | POA: Diagnosis present

## 2015-06-03 DIAGNOSIS — D72829 Elevated white blood cell count, unspecified: Secondary | ICD-10-CM | POA: Diagnosis present

## 2015-06-03 DIAGNOSIS — E876 Hypokalemia: Secondary | ICD-10-CM | POA: Diagnosis present

## 2015-06-03 DIAGNOSIS — R402432 Glasgow coma scale score 3-8, at arrival to emergency department: Secondary | ICD-10-CM | POA: Diagnosis present

## 2015-06-03 DIAGNOSIS — I609 Nontraumatic subarachnoid hemorrhage, unspecified: Secondary | ICD-10-CM

## 2015-06-03 DIAGNOSIS — E872 Acidosis: Secondary | ICD-10-CM | POA: Diagnosis present

## 2015-06-03 DIAGNOSIS — J9601 Acute respiratory failure with hypoxia: Secondary | ICD-10-CM | POA: Diagnosis present

## 2015-06-03 DIAGNOSIS — S065X9S Traumatic subdural hemorrhage with loss of consciousness of unspecified duration, sequela: Secondary | ICD-10-CM | POA: Diagnosis not present

## 2015-06-03 DIAGNOSIS — G40901 Epilepsy, unspecified, not intractable, with status epilepticus: Secondary | ICD-10-CM | POA: Diagnosis present

## 2015-06-03 DIAGNOSIS — F17200 Nicotine dependence, unspecified, uncomplicated: Secondary | ICD-10-CM | POA: Diagnosis present

## 2015-06-03 DIAGNOSIS — S065X9A Traumatic subdural hemorrhage with loss of consciousness of unspecified duration, initial encounter: Secondary | ICD-10-CM | POA: Diagnosis present

## 2015-06-03 DIAGNOSIS — Z87820 Personal history of traumatic brain injury: Secondary | ICD-10-CM | POA: Diagnosis not present

## 2015-06-03 DIAGNOSIS — G936 Cerebral edema: Secondary | ICD-10-CM | POA: Diagnosis present

## 2015-06-03 DIAGNOSIS — E860 Dehydration: Secondary | ICD-10-CM | POA: Diagnosis present

## 2015-06-03 DIAGNOSIS — R451 Restlessness and agitation: Secondary | ICD-10-CM | POA: Diagnosis present

## 2015-06-03 DIAGNOSIS — R569 Unspecified convulsions: Secondary | ICD-10-CM

## 2015-06-03 DIAGNOSIS — G9340 Encephalopathy, unspecified: Secondary | ICD-10-CM

## 2015-06-03 DIAGNOSIS — R509 Fever, unspecified: Secondary | ICD-10-CM | POA: Diagnosis present

## 2015-06-03 DIAGNOSIS — S065XAA Traumatic subdural hemorrhage with loss of consciousness status unknown, initial encounter: Secondary | ICD-10-CM | POA: Diagnosis present

## 2015-06-03 DIAGNOSIS — N179 Acute kidney failure, unspecified: Secondary | ICD-10-CM | POA: Diagnosis present

## 2015-06-03 LAB — COMPREHENSIVE METABOLIC PANEL
ALT: 10 U/L — ABNORMAL LOW (ref 17–63)
AST: 32 U/L (ref 15–41)
Albumin: 4.1 g/dL (ref 3.5–5.0)
Alkaline Phosphatase: 63 U/L (ref 38–126)
Anion gap: 25 — ABNORMAL HIGH (ref 5–15)
BUN: 8 mg/dL (ref 6–20)
CO2: 11 mmol/L — ABNORMAL LOW (ref 22–32)
Calcium: 10.3 mg/dL (ref 8.9–10.3)
Chloride: 100 mmol/L — ABNORMAL LOW (ref 101–111)
Creatinine, Ser: 1.67 mg/dL — ABNORMAL HIGH (ref 0.61–1.24)
GFR calc Af Amer: 57 mL/min — ABNORMAL LOW (ref >60)
GFR calc non Af Amer: 49 mL/min — ABNORMAL LOW (ref >60)
Glucose, Bld: 174 mg/dL — ABNORMAL HIGH (ref 65–99)
Potassium: 4.7 mmol/L (ref 3.5–5.1)
Sodium: 136 mmol/L (ref 135–145)
Total Bilirubin: 0.6 mg/dL (ref 0.3–1.2)
Total Protein: 7.8 g/dL (ref 6.5–8.1)

## 2015-06-03 LAB — BLOOD GAS, ARTERIAL
Acid-base deficit: 5.2 mmol/L — ABNORMAL HIGH (ref 0.0–2.0)
Bicarbonate: 17.8 mEq/L — ABNORMAL LOW (ref 20.0–24.0)
Drawn by: 419771
FIO2: 1
MECHVT: 590 mL
O2 Saturation: 99.8 %
PEEP: 5 cmH2O
Patient temperature: 98.6
RATE: 18 resp/min
TCO2: 18.6 mmol/L (ref 0–100)
pCO2 arterial: 25 mmHg — ABNORMAL LOW (ref 35.0–45.0)
pH, Arterial: 7.467 — ABNORMAL HIGH (ref 7.350–7.450)
pO2, Arterial: 292 mmHg — ABNORMAL HIGH (ref 80.0–100.0)

## 2015-06-03 LAB — CBC WITH DIFFERENTIAL/PLATELET
Basophils Absolute: 0.1 10*3/uL (ref 0.0–0.1)
Basophils Relative: 1 %
Eosinophils Absolute: 0.1 10*3/uL (ref 0.0–0.7)
Eosinophils Relative: 1 %
HCT: 42.9 % (ref 39.0–52.0)
Hemoglobin: 14.6 g/dL (ref 13.0–17.0)
Lymphocytes Relative: 43 %
Lymphs Abs: 5.7 10*3/uL — ABNORMAL HIGH (ref 0.7–4.0)
MCH: 33.2 pg (ref 26.0–34.0)
MCHC: 34 g/dL (ref 30.0–36.0)
MCV: 97.5 fL (ref 78.0–100.0)
Monocytes Absolute: 1.9 10*3/uL — ABNORMAL HIGH (ref 0.1–1.0)
Monocytes Relative: 14 %
Neutro Abs: 5.4 10*3/uL (ref 1.7–7.7)
Neutrophils Relative %: 41 %
Platelets: 358 10*3/uL (ref 150–400)
RBC: 4.4 MIL/uL (ref 4.22–5.81)
RDW: 12.6 % (ref 11.5–15.5)
WBC: 13.1 10*3/uL — ABNORMAL HIGH (ref 4.0–10.5)

## 2015-06-03 LAB — RAPID URINE DRUG SCREEN, HOSP PERFORMED
Amphetamines: NOT DETECTED
Barbiturates: NOT DETECTED
Benzodiazepines: NOT DETECTED
Cocaine: NOT DETECTED
Opiates: POSITIVE — AB
Tetrahydrocannabinol: NOT DETECTED

## 2015-06-03 LAB — I-STAT CG4 LACTIC ACID, ED
Lactic Acid, Venous: 15.21 mmol/L (ref 0.5–2.0)
Lactic Acid, Venous: 3.94 mmol/L (ref 0.5–2.0)

## 2015-06-03 LAB — URINALYSIS, ROUTINE W REFLEX MICROSCOPIC
Glucose, UA: 100 mg/dL — AB
Ketones, ur: 15 mg/dL — AB
Leukocytes, UA: NEGATIVE
Nitrite: NEGATIVE
Protein, ur: 100 mg/dL — AB
Specific Gravity, Urine: 1.029 (ref 1.005–1.030)
pH: 7 (ref 5.0–8.0)

## 2015-06-03 LAB — PROCALCITONIN: Procalcitonin: 3.97 ng/mL

## 2015-06-03 LAB — URINE MICROSCOPIC-ADD ON

## 2015-06-03 LAB — TRIGLYCERIDES: Triglycerides: 136 mg/dL (ref <150)

## 2015-06-03 LAB — GLUCOSE, CAPILLARY: Glucose-Capillary: 100 mg/dL — ABNORMAL HIGH (ref 65–99)

## 2015-06-03 LAB — TROPONIN I: Troponin I: 0.12 ng/mL — ABNORMAL HIGH (ref <0.031)

## 2015-06-03 MED ORDER — FENTANYL BOLUS VIA INFUSION
50.0000 ug | INTRAVENOUS | Status: DC | PRN
  Filled 2015-06-03: qty 50

## 2015-06-03 MED ORDER — SODIUM CHLORIDE 0.9 % IV SOLN
25.0000 ug/h | INTRAVENOUS | Status: DC
  Administered 2015-06-03 (×2): 50 ug/h via INTRAVENOUS
  Filled 2015-06-03: qty 50

## 2015-06-03 MED ORDER — SODIUM CHLORIDE 0.9 % IV SOLN: 250.0000 mL | INTRAVENOUS | Status: DC | PRN

## 2015-06-03 MED ORDER — SODIUM CHLORIDE 0.9 % IV SOLN
1500.0000 mg | Freq: Once | INTRAVENOUS | Status: AC
  Administered 2015-06-03: 1500 mg via INTRAVENOUS
  Filled 2015-06-03: qty 15

## 2015-06-03 MED ORDER — FOSPHENYTOIN SODIUM 500 MG PE/10ML IJ SOLN: 20.0000 mg/kg | Freq: Once | INTRAMUSCULAR | Status: DC

## 2015-06-03 MED ORDER — LORAZEPAM 2 MG/ML IJ SOLN
INTRAMUSCULAR | Status: AC
  Filled 2015-06-03: qty 1

## 2015-06-03 MED ORDER — PIPERACILLIN-TAZOBACTAM 3.375 G IVPB 30 MIN
3.3750 g | Freq: Once | INTRAVENOUS | Status: AC
  Administered 2015-06-03: 3.375 g via INTRAVENOUS
  Filled 2015-06-03: qty 50

## 2015-06-03 MED ORDER — FENTANYL CITRATE (PF) 100 MCG/2ML IJ SOLN: 50.0000 ug | Freq: Once | INTRAMUSCULAR | Status: DC

## 2015-06-03 MED ORDER — LORAZEPAM 2 MG/ML IJ SOLN
2.0000 mg | Freq: Once | INTRAMUSCULAR | Status: AC
  Administered 2015-06-03: 2 mg via INTRAVENOUS

## 2015-06-03 MED ORDER — SODIUM CHLORIDE 0.9 % IV SOLN
10.0000 ug/h | INTRAVENOUS | Status: DC
  Filled 2015-06-03: qty 50

## 2015-06-03 MED ORDER — PANTOPRAZOLE SODIUM 40 MG IV SOLR
40.0000 mg | Freq: Every day | INTRAVENOUS | Status: DC
  Administered 2015-06-03 – 2015-06-04 (×2): 40 mg via INTRAVENOUS
  Filled 2015-06-03 (×2): qty 40

## 2015-06-03 MED ORDER — SODIUM CHLORIDE 0.9 % IV BOLUS (SEPSIS)
1000.0000 mL | Freq: Once | INTRAVENOUS | Status: AC
  Administered 2015-06-03: 1000 mL via INTRAVENOUS

## 2015-06-03 MED ORDER — SODIUM CHLORIDE 0.9 % IV SOLN
INTRAVENOUS | Status: DC
  Administered 2015-06-03: 21:00:00 via INTRAVENOUS

## 2015-06-03 MED ORDER — PROPOFOL 1000 MG/100ML IV EMUL
0.0000 ug/kg/min | INTRAVENOUS | Status: DC
  Administered 2015-06-03: 40 ug/kg/min via INTRAVENOUS
  Administered 2015-06-04: 50 ug/kg/min via INTRAVENOUS
  Administered 2015-06-04: 40 ug/kg/min via INTRAVENOUS
  Filled 2015-06-03 (×3): qty 100

## 2015-06-03 MED ORDER — HYDROMORPHONE HCL 1 MG/ML IJ SOLN: 1.0000 mg | Freq: Once | INTRAMUSCULAR | Status: DC

## 2015-06-03 MED ORDER — LORAZEPAM 2 MG/ML IJ SOLN
INTRAMUSCULAR | Status: AC
  Administered 2015-06-03: 2 mg
  Filled 2015-06-03: qty 1

## 2015-06-03 MED ORDER — ACETAMINOPHEN 650 MG RE SUPP
650.0000 mg | Freq: Once | RECTAL | Status: AC
  Administered 2015-06-03: 650 mg via RECTAL
  Filled 2015-06-03: qty 1

## 2015-06-03 MED ORDER — PROPOFOL 1000 MG/100ML IV EMUL
INTRAVENOUS | Status: AC
  Administered 2015-06-03: 40 ug/kg/min via INTRAVENOUS
  Filled 2015-06-03: qty 100

## 2015-06-03 MED ORDER — SODIUM CHLORIDE 0.9 % IV SOLN
25.0000 ug/h | INTRAVENOUS | Status: DC
  Filled 2015-06-03: qty 50

## 2015-06-03 MED ORDER — ETOMIDATE 2 MG/ML IV SOLN
20.0000 mg | Freq: Once | INTRAVENOUS | Status: AC
  Administered 2015-06-03: 20 mg via INTRAVENOUS

## 2015-06-03 MED ORDER — SODIUM CHLORIDE 0.9 % IV SOLN
1250.0000 mg | INTRAVENOUS | Status: AC
  Administered 2015-06-03: 1250 mg via INTRAVENOUS
  Filled 2015-06-03: qty 25

## 2015-06-03 MED ORDER — VANCOMYCIN HCL 10 G IV SOLR
1500.0000 mg | Freq: Once | INTRAVENOUS | Status: AC
  Administered 2015-06-03: 1500 mg via INTRAVENOUS
  Filled 2015-06-03: qty 1500

## 2015-06-03 MED ORDER — PROPOFOL 1000 MG/100ML IV EMUL
0.0000 ug/kg/min | INTRAVENOUS | Status: DC
  Administered 2015-06-03 (×2): 40 ug/kg/min via INTRAVENOUS
  Administered 2015-06-03: 30 ug/kg/min via INTRAVENOUS

## 2015-06-03 NOTE — ED Notes (Signed)
Patient is very agitated, verbal order for  of ativan from Dr. Amada Jupiter at the bedside.

## 2015-06-03 NOTE — ED Notes (Signed)
Family at bedside. 

## 2015-06-03 NOTE — Consult Note (Signed)
Neurology Consultation Reason for Consult: Seizure Referring Physician: Preston Fleeting, D  CC: Seizure  History is obtained from:referring providers  HPI: Michael Oneill is a 40 y.o. male recently discharged following subdural hematoma. He had a previous right craniotomy for SDH 20 years ago and then complained of headache and left sided weakness. He was seen in the ER and found to have mixed density SDH on the right. He was observed overnight. He was doing well, but had a seizure at home and was brought to the ER. He was combative in the ER and required intubation. His nurse felt that he would follow commands intermittently for her, but has remained combative the entire time.   He continues to not follow commands, becoming intensely agitated whenever his sedation is lightened.   ROS:  Unable to obtain due to altered mental status.   Past Medical History  Diagnosis Date  . TBI (traumatic brain injury) (HCC) 1997     Family history: Unable to obtain due to altered mental status   Social History:  reports that he has been smoking.  He does not have any smokeless tobacco history on file. He reports that he drinks alcohol. He reports that he does not use illicit drugs.   Exam: Current vital signs: BP 130/103 mmHg  Pulse 73  Temp(Src) 99.5 F (37.5 C) (Core (Comment))  Resp 18  Ht  (1.778 m)  Wt 65 kg (143 lb 4.8 oz)  BMI 20.56 kg/m2  SpO2 100% Vital signs in last 24 hours: Temp:  [99.3 F (37.4 C)-102.4 F (39.1 C)] 99.5 F (37.5 C) (02/14 1930) Pulse Rate:  [71-168] 73 (02/14 1930) Resp:  [18-26] 18 (02/14 1930) BP: (83-195)/(58-123) 130/103 mmHg (02/14 1930) SpO2:  [93 %-100 %] 100 % (02/14 1930) FiO2 (%):  [100 %] 100 % (02/14 1705) Weight:  [65 kg (143 lb 4.8 oz)] 65 kg (143 lb 4.8 oz) (02/14 1708)   Physical Exam  Constitutional: Appears well-developed and well-nourished.  Psych: Agitated Eyes: No scleral injection HENT: ET tube in place Head: Normocephalic.   Cardiovascular: Normal rate and regular rhythm.  Respiratory: Effort normal GI: Soft.  No distension. There is no tenderness.  Skin: WDI  Neuro: Mental Status: Patient opens eyes but does not follow commands. He is intensely agitated. Cranial Nerves: II: Does not blink to threat. Pupils are equal, round, and reactive to light.   III,IV, VI: Eyes are slightly disconjugate, essentially midline bilaterally, does not look to right or left V: VII: Blinks to eyelid stimulation bilaterally VIII, X, XI, XII: Unable to assess secondary to patient's altered mental status.  Motor: He moves both arms purposefully, and strains against restraints with bilateral legs. Sensory: He appears to respond to noxious stimulation in all 4 extremities Cerebellar: Does not comply  I have reviewed labs in epic and the results pertinent to this consultation are: Mildly elevated creatinine  I have reviewed the images obtained: CT head - slightly worsening subdural hematoma with increasing shift  Impression: 40 year old male with new onset seizures in the setting of worsening subdural hematoma. Without return to baseline, partial complex status does need to be ruled out and therefore I have asked for a stat EEG.  Recommendations: 1) stat EEG 2) continue Keppra and Dilantin 3) further medication titration depending on EEG results 4) neurosurgical consultation regarding subdural hematoma 5) we will continue to follow   Ritta Slot, MD [facility] [phone]  If 7pm- 7am, please page neurology on call as listed in AMION.

## 2015-06-03 NOTE — ED Notes (Signed)
Updated critical care provider at the bedside.

## 2015-06-03 NOTE — ED Notes (Signed)
Pt in from home via [facility], per report pt was found supine, diaphoretic & unresponsive by EMS, per response the pt was found by his mother, per records pt was seen here, admitted & discharged yesterday for a subdural, pt extremely diaphoretic, confused, combative & hot to touch, GCS 8 upon arrival to ED, pt on BVM, pt combative, pt requiring immediate intervention for breathing, pt ST

## 2015-06-03 NOTE — Consult Note (Signed)
CC:  Chief Complaint  Patient presents with  . Altered Mental Status    HPI: Michael Oneill is a 40 y.o. male who was discharged yesterday from the hospital after initially presenting with headache and subjective left weakness. He was observed overnight with repeat CT which was stable. While at home today he suffered a seizure and was brought to the ED. Upon arrival he was combative and appeared to have symmetric strength, but required intubation. Repeat CT demonstrated enlargement of his SDH and neurosurgery was consulted.  PMH: Past Medical History  Diagnosis Date  . TBI (traumatic brain injury) (HCC) 1997    PSH: Past Surgical History  Procedure Laterality Date  . Brain surgery      SH: Social History  Substance Use Topics  . Smoking status: Current Every Day Smoker  . Smokeless tobacco: None  . Alcohol Use: Yes    MEDS: Prior to Admission medications   Medication Sig Start Date End Date Taking? Authorizing Provider  HYDROcodone-acetaminophen (NORCO/VICODIN) 5-325 MG tablet Take 1-2 tablets by mouth every 6 (six) hours as needed for moderate pain. 06/02/15   Julio Sicks, MD  ibuprofen (ADVIL,MOTRIN) 200 MG tablet Take 400 mg by mouth every 6 (six) hours as needed.    Historical Provider, MD    ALLERGY: No Known Allergies  ROS: ROS  NEUROLOGIC EXAM: Pt intubated, sedated on propofol and recently given ativan. Pupils ~2-31mm, reactive  Per neurology, upon decreased sedation Eyes open ?following commands Moving all extremities good strength  IMGAING: CTH reviewed demonstrating subacute right frontal SDH slightly enlarged in comparison to prior. There is perhaps subtle <42mm increase in R-->L MLS. No HCP  IMPRESSION: - 40 y.o. male with altered mental status likely related to SZ. His subdural, although subtly larger, at this point I don't think requires evacuation.  PLAN: - Admit to ICU  - AEDs already ordered per neurology - Will need EEG

## 2015-06-03 NOTE — Progress Notes (Signed)
EEG completed, results pending. 

## 2015-06-03 NOTE — ED Notes (Signed)
Attempted report, left callback number. Misty Stanley to be receiving RN

## 2015-06-03 NOTE — Progress Notes (Signed)
   06/03/15 1800  Clinical Encounter Type  Visited With Patient  Visit Type ED;Critical Care  Referral From Nurse  Spiritual Encounters  Spiritual Needs Emotional  CH met and escorted family to consult B; Family med with MD with [place] present.  [place] offered hospitality and emotional support: according to MD, family can visit with pt 2 at a time; Ch will try to assist RN; [facility] available as needed.  Gwynn Burly 6:33 PM

## 2015-06-03 NOTE — H&P (Signed)
PULMONARY / CRITICAL CARE MEDICINE   Name: Michael Oneill MRN: [id number] DOB: 19-Mar-1976    ADMISSION DATE:  06/03/2015 CONSULTATION DATE:  06/03/2015  REFERRING MD:  Dione Booze, MD  CHIEF COMPLAINT:  Altered Mental Status  HISTORY OF PRESENT ILLNESS:   Michael Oneill is a 40 year old with PMH significant for TBI. Status post previous right-sided craniotomy for evacuation of subdural hematoma approximately 20 years ago.  Discharged from hospital yesterday(2/13) after having a subdural hematoma on 2/11. At that time patient presented with a history of progressive headache with some left sided numbness and subjective weakness. He had hit his head a couple days prior. No loss of consciousness associated with this. Was not taking anticoagulants. No history of seizures. Neurosurgery was following and stated subdural was stable so discharged home with outpatient follow-up. He was brought into ED today by EMS. Found down by his mother after possible seizure like activity. On EMS arrival was found supine, diaphoretic and unresponsive. In ED patient noted to be confused and combative. GCS scale 8 on arrival. He had elevated temperatures. Required intubation in ED for combativeness.  PAST MEDICAL HISTORY :  He  has a past medical history of TBI (traumatic brain injury) (HCC) (1997).  PAST SURGICAL HISTORY: He  has past surgical history that includes Brain surgery.  No Known Allergies  No current facility-administered medications on file prior to encounter.   Current Outpatient Prescriptions on File Prior to Encounter  Medication Sig  . HYDROcodone-acetaminophen (NORCO/VICODIN) 5-325 MG tablet Take 1-2 tablets by mouth every 6 (six) hours as needed for moderate pain.  Marland Kitchen ibuprofen (ADVIL,MOTRIN) 200 MG tablet Take 400 mg by mouth every 6 (six) hours as needed.    FAMILY HISTORY:  His has no family status information on file.   SOCIAL HISTORY: He  reports that he has been smoking.  He does  not have any smokeless tobacco history on file. He reports that he drinks alcohol. He reports that he does not use illicit drugs.  REVIEW OF SYSTEMS:   Unable to obtain due to AMS.  SUBJECTIVE:  Post-ictal. Unable to follow commands. Agitated off propofol.   VITAL SIGNS: BP 130/103 mmHg  Pulse 73  Temp(Src) 99.5 F (37.5 C) (Core (Comment))  Resp 18  Ht  (1.778 m)  Wt 143 lb 4.8 oz (65 kg)  BMI 20.56 kg/m2  SpO2 100%  HEMODYNAMICS:    VENTILATOR SETTINGS: Vent Mode:  [-] PRVC FiO2 (%):  [100 %] 100 % Set Rate:  [18 bmp] 18 bmp Vt Set:  [590 mL] 590 mL PEEP:  [5 cmH20] 5 cmH20 Plateau Pressure:  [14 cmH20] 14 cmH20  INTAKE / OUTPUT: I/O last 3 completed shifts: In: 1000 [I.V.:1000] Out: -   PHYSICAL EXAMINATION: General:  Well-appearing AAM, agitated off sedation, awake and alert Neuro:  Unable to follow commands, awake and alert, disconjugate gaze, good tone and strength HEENT:  EET, [place]/AT, no scleral icterus, PERRL Cardiovascular:  RRR, no m/r/g Lungs: CTAB, no wheezes, rhonchi Abdomen:  Soft, non-distend, +BS, no masses Musculoskeletal:  Moves all extremities, no edema Skin:  Warm and dry, intact  LABS:  BMET  Recent Labs Lab 05/31/15 1137 05/31/15 1139 06/03/15 1710  NA 136 137 136  K 4.5 4.2 4.7  CL 100* 99* 100*  CO2 25  --  11*  BUN <5* 6 8  CREATININE 1.03 1.20 1.67*  GLUCOSE 99 98 174*    Electrolytes  Recent Labs Lab 05/31/15 1137 06/03/15 1710  CALCIUM  9.2 10.3    CBC  Recent Labs Lab 05/31/15 1137 05/31/15 1139 06/03/15 1710  WBC 7.0  --  13.1*  HGB 13.1 15.0 14.6  HCT 38.1* 44.0 42.9  PLT 258  --  358    Coag's  Recent Labs Lab 05/31/15 1137  APTT 28  INR 1.02    Sepsis Markers  Recent Labs Lab 06/03/15 1720  LATICACIDVEN 15.21*    ABG No results for input(s): PHART, PCO2ART, PO2ART in the last 168 hours.  Liver Enzymes  Recent Labs Lab 05/31/15 1137 06/03/15 1710  AST 30 32  ALT 16* 10*   ALKPHOS 71 63  BILITOT 0.6 0.6  ALBUMIN 4.2 4.1    Cardiac Enzymes No results for input(s): TROPONINI, PROBNP in the last 168 hours.  Glucose No results for input(s): GLUCAP in the last 168 hours.  Imaging Ct Head Wo Contrast  06/03/2015  CLINICAL DATA:  Recent discharge for improving closed head injury, now with worsening mental status. EXAM: CT HEAD WITHOUT CONTRAST TECHNIQUE: Contiguous axial images were obtained from the base of the skull through the vertex without intravenous contrast. COMPARISON:  CT head 05/31/15 and 06/01/2015. FINDINGS: The patient has developed increasing mass effect from an extra-axial collection on the RIGHT. The acute component, located over the posterior lateral temporal lobe on the RIGHT continues to involute, but there is increasing low to medium attenuation fluid, CONSISTENT WITH SUBACUTE SUBDURAL HEMATOMA, over the RIGHT frontal and posterior frontal convexity, and measuring up to 10 mm thickness on image 23 series 2. Right-to-left shift is also increasing at 8 mm and there is mild rotation of the brainstem and effacement of the suprasellar cistern due to uncal displacement. Evidence for chronic RIGHT frontotemporal parietal craniotomy, without acute skull fracture. No pneumocephalus. No parenchymal hemorrhage. IMPRESSION: Worsening late acute to early subacute RIGHT subdural hematoma. Increasing right-to-left shift of 8 mm. Increasing multiple prominence into the suprasellar cistern and brainstem. These results were called by telephone at the time of interpretation on 06/03/2015 at 7:00 pm to Dr. Preston Fleeting, who verbally acknowledged these results. Electronically Signed   By: Elsie Stain M.D.   On: 06/03/2015 19:06   Dg Chest Portable 1 View  06/03/2015  CLINICAL DATA:  Intubation, possible seizure, history of traumatic brain injury, smoker EXAM: PORTABLE CHEST 1 VIEW COMPARISON:  Portable exam 1714 hours compared to 03/01/2013 FINDINGS: Tip of endotracheal tube  projects 3.9 cm above carina. Nasogastric tube extends into stomach. Normal heart size, mediastinal contours, and pulmonary vascularity. Minimal peribronchial thickening. Lungs clear. No pleural effusion or pneumothorax. Bones unremarkable. IMPRESSION: Tube positions as above. Minimal bronchitic changes without infiltrate. Electronically Signed   By: Ulyses Southward M.D.   On: 06/03/2015 17:32    STUDIES:  CXR 2/14>> normal CT head 2/14>> worsening right subdural hematoma. Increased shift  CULTURES: Blood 2/14>> Urine 2/14>>  ANTIBIOTICS: S/p vanc and zoysn in ED x1 dose  SIGNIFICANT EVENTS: 2/11>> right-sided subacute subdural hematoma  2/14 presented to ED after seizure-like activity; increase in size of subdural  LINES/TUBES: ETT 2/14>>  DISCUSSION: Michael Oneill is a 40 y.o. man  ASSESSMENT / PLAN:  PULMONARY A: No primary pulmonary process, CXR with no infiltrates  Acute respiratory failure due to LOC, Intubated due to combativeness and ?post-ictal state  P:   Full vent support Minimal settings to keep O2 sats >92% Can likely extubate when AMS improves and following commands CXR in AM Collect ABG; on admission CO2 11  CARDIOVASCULAR A:  No acute  issues  P:  Monitor  RENAL A:   AKI Dehydration - UA with hyaline cast, elevated Uspec, and ketones Acute AG metabolic acidosis,   P:   IVF NS @75mL  Avoid nephrotoxic medications BMP in AM with hydration Consider rhabdo in setting seizures > check CPK Monitor I/Os Consider renal US if not improving  GASTROINTESTINAL A:   No acute issues  P:   NPO SPU: protonix SCDs  HEMATOLOGIC A:   Leukocytosis - need to r/o infectious source; possibly 2/2 ?seizure  P:  CBC in AM Hold off on anticoagulation in setting of bleed  INFECTIOUS A:   No source of infection - UA w/o signs of infection, CXR normal Febrile on admission Lactic acid elevated - most likely 2/2 ?seizure  P:   Follow-up cultures S/p  vanc/zoysn in ED Hold off on Abx for now Pct collected  trend lactic acid  ENDOCRINE A:   No acute issues  P:     NEUROLOGIC A:   Acute encephalopathy ?new-onset seizures - no h/o seizure, witnessed seizure-liek activity at home Subdural hematoma - increased from prior study H/o TBI  P:   RASS goal: -1 Fentanyl and propofol gtt Neurology and neurosurgery following  EEG Continue keppra and dilantin UDS pending Restraints prn for combativeness   FAMILY  - Updates: No family at bedside. Updated by ED provider. - Inter-disciplinary family meet or Palliative Care meeting due by:  2/21   Caryl Ada, DO 06/03/2015, 8:02 PM PGY-2, [facility]   Attending Note:  I have examined patient, reviewed labs, studies and notes. I have discussed the case with Dr Doroteo Glassman, and I agree with the data and plans as I have amended above. 40 yo man, hx TBI, recent traumatic SDH deemed stable. Now readmitted with apparent seizure activity c/b lactic acidosis and ARF.  Required sedation and ETT in ED due combativeness. Head Ct showed interval increase in SDH. On my eval he is sedated on propofol, intubated, without any evidence continued seizures. NSGY recommends following, Neuro has loaded on anti-epileptics. Lactate is clearing rapidly, suspect due to seizure activity. We will keep him sedated overnight, assess for possible WUA and extubation on 2/15. Follow NSGY and neuro recs. Will check CPK with renal failure after seizures. Independent critical care time is 40 minutes.   Levy Pupa, MD, PhD 06/03/2015, 11:48 PM [facility] [phone] or if no answer [phone]

## 2015-06-03 NOTE — ED Notes (Signed)
Pt in via EMS from home, called out for seizure like activity, pt combative on arrival, unable to follow commands, preparing for intubation on arrival

## 2015-06-03 NOTE — ED Notes (Signed)
Pt's mother, Rocky Crafts [phone] (h)  [phone]

## 2015-06-03 NOTE — ED Notes (Signed)
Mri technician at the bedside.

## 2015-06-03 NOTE — ED Notes (Signed)
Notified respiratory therapist preparing for transport.

## 2015-06-03 NOTE — ED Notes (Signed)
eeg being set up at the bedside.

## 2015-06-03 NOTE — ED Provider Notes (Signed)
CSN: [id number]     Arrival date & time 06/03/15  1706 History   First MD Initiated Contact with Patient 06/03/15 1705     Chief Complaint  Patient presents with  . Altered Mental Status   Patient is a 40 y.o. male presenting with seizures. The history is provided by the EMS personnel. No language interpreter was used.  Seizures Seizure activity on arrival: no   Seizure type:  Grand mal Initial focality:  Unable to specify Episode characteristics: abnormal movements, combativeness, confusion, eye deviation, generalized shaking, partial responsiveness and stiffening   Episode characteristics: no apnea, no focal shaking and no incontinence   Postictal symptoms: confusion   Return to baseline: no   Severity:  Severe Timing:  Once Progression:  Unable to specify Context: not alcohol withdrawal   Context comment:  Recent SDH Recent head injury:  Over 24 hours ago PTA treatment:  None History of seizures: no     Past Medical History  Diagnosis Date  . TBI (traumatic brain injury) (HCC) 1997   Past Surgical History  Procedure Laterality Date  . Brain surgery     No family history on file. Social History  Substance Use Topics  . Smoking status: Current Every Day Smoker  . Smokeless tobacco: None  . Alcohol Use: Yes    Review of Systems  Unable to perform ROS: Patient unresponsive  Neurological: Positive for seizures.      Allergies  Review of patient's allergies indicates no known allergies.  Home Medications   Prior to Admission medications   Medication Sig Start Date End Date Taking? Authorizing Provider  HYDROcodone-acetaminophen (NORCO/VICODIN) 5-325 MG tablet Take 1-2 tablets by mouth every 6 (six) hours as needed for moderate pain. 06/02/15   Julio Sicks, MD  ibuprofen (ADVIL,MOTRIN) 200 MG tablet Take 400 mg by mouth every 6 (six) hours as needed.    Historical Provider, MD   BP 108/78 mmHg  Pulse 59  Temp(Src) 97.7 F (36.5 C) (Core (Comment))  Resp 18  Ht  5\' 10"  (1.778 m)  Wt 69.5 kg  BMI 21.98 kg/m2  SpO2 100% Physical Exam  Constitutional: He appears well-developed and well-nourished.  HENT:  Head: Normocephalic and atraumatic.  Eyes: Pupils are equal, round, and reactive to light.  Cardiovascular: Normal rate and regular rhythm.  Exam reveals no gallop and no friction rub.   No murmur heard. Pulmonary/Chest: No respiratory distress. He has no wheezes.  Abdominal: Soft. He exhibits no distension. There is no tenderness.  Neurological: GCS eye subscore is 4. GCS verbal subscore is 1. GCS motor subscore is 5.  Nursing note and vitals reviewed.   ED Course  .Intubation Date/Time: 06/04/2015 1:38 AM Performed by: Dan Humphreys Authorized by: Preston Fleeting, DAVID Consent: The procedure was performed in an emergent situation. Patient identity confirmed: arm band Indications: airway protection Intubation method: video-assisted Patient status: paralyzed (RSI) Preoxygenation: BVM Sedatives: etomidate Paralytic: succinylcholine Laryngoscope size: Miller 3 Tube size: 7.5 mm Tube type: cuffed Number of attempts: 1 Cricoid pressure: no Cords visualized: yes Post-procedure assessment: chest rise and ETCO2 monitor Breath sounds: equal and absent over the epigastrium Cuff inflated: yes ETT to lip: 25 cm Tube secured with: ETT holder Chest x-ray interpreted by me and other physician. Chest x-ray findings: endotracheal tube in appropriate position Patient tolerance: Patient tolerated the procedure well with no immediate complications   (including critical care time) Labs Review Labs Reviewed  CBC WITH DIFFERENTIAL/PLATELET - Abnormal; Notable for the following:    WBC  13.1 (*)    Lymphs Abs 5.7 (*)    Monocytes Absolute 1.9 (*)    All other components within normal limits  COMPREHENSIVE METABOLIC PANEL - Abnormal; Notable for the following:    Chloride 100 (*)    CO2 11 (*)    Glucose, Bld 174 (*)    Creatinine, Ser 1.67 (*)    ALT  10 (*)    GFR calc non Af Amer 49 (*)    GFR calc Af Amer 57 (*)    Anion gap 25 (*)    All other components within normal limits  URINALYSIS, ROUTINE W REFLEX MICROSCOPIC (NOT AT [facility]) - Abnormal; Notable for the following:    Color, Urine AMBER (*)    Glucose, UA 100 (*)    Hgb urine dipstick SMALL (*)    Bilirubin Urine SMALL (*)    Ketones, ur 15 (*)    Protein, ur 100 (*)    All other components within normal limits  URINE MICROSCOPIC-ADD ON - Abnormal; Notable for the following:    Squamous Epithelial / LPF 0-5 (*)    Bacteria, UA RARE (*)    Casts HYALINE CASTS (*)    All other components within normal limits  TROPONIN I - Abnormal; Notable for the following:    Troponin I 0.12 (*)    All other components within normal limits  URINE RAPID DRUG SCREEN, HOSP PERFORMED - Abnormal; Notable for the following:    Opiates POSITIVE (*)    All other components within normal limits  BLOOD GAS, ARTERIAL - Abnormal; Notable for the following:    pH, Arterial 7.467 (*)    pCO2 arterial 25.0 (*)    pO2, Arterial 292 (*)    Bicarbonate 17.8 (*)    Acid-base deficit 5.2 (*)    All other components within normal limits  GLUCOSE, CAPILLARY - Abnormal; Notable for the following:    Glucose-Capillary 100 (*)    All other components within normal limits  I-STAT CG4 LACTIC ACID, ED - Abnormal; Notable for the following:    Lactic Acid, Venous 15.21 (*)    All other components within normal limits  I-STAT CG4 LACTIC ACID, ED - Abnormal; Notable for the following:    Lactic Acid, Venous 3.94 (*)    All other components within normal limits  CULTURE, BLOOD (ROUTINE X 2)  CULTURE, BLOOD (ROUTINE X 2)  URINE CULTURE  TRIGLYCERIDES  PROCALCITONIN  CBC  BASIC METABOLIC PANEL  MAGNESIUM  PHOSPHORUS  TROPONIN I  TROPONIN I  PROCALCITONIN  LACTIC ACID, PLASMA  CK    Imaging Review Ct Head Wo Contrast  06/03/2015  CLINICAL DATA:  Recent discharge for improving closed head injury, now  with worsening mental status. EXAM: CT HEAD WITHOUT CONTRAST TECHNIQUE: Contiguous axial images were obtained from the base of the skull through the vertex without intravenous contrast. COMPARISON:  CT head 05/31/15 and 06/01/2015. FINDINGS: The patient has developed increasing mass effect from an extra-axial collection on the RIGHT. The acute component, located over the posterior lateral temporal lobe on the RIGHT continues to involute, but there is increasing low to medium attenuation fluid, CONSISTENT WITH SUBACUTE SUBDURAL HEMATOMA, over the RIGHT frontal and posterior frontal convexity, and measuring up to 10 mm thickness on image 23 series 2. Right-to-left shift is also increasing at 8 mm and there is mild rotation of the brainstem and effacement of the suprasellar cistern due to uncal displacement. Evidence for chronic RIGHT frontotemporal parietal craniotomy, without  acute skull fracture. No pneumocephalus. No parenchymal hemorrhage. IMPRESSION: Worsening late acute to early subacute RIGHT subdural hematoma. Increasing right-to-left shift of 8 mm. Increasing multiple prominence into the suprasellar cistern and brainstem. These results were called by telephone at the time of interpretation on 06/03/2015 at 7:00 pm to Dr. Preston Fleeting, who verbally acknowledged these results. Electronically Signed   By: Elsie Stain M.D.   On: 06/03/2015 19:06   Dg Chest Portable 1 View  06/03/2015  CLINICAL DATA:  Intubation, possible seizure, history of traumatic brain injury, smoker EXAM: PORTABLE CHEST 1 VIEW COMPARISON:  Portable exam 1714 hours compared to 03/01/2013 FINDINGS: Tip of endotracheal tube projects 3.9 cm above carina. Nasogastric tube extends into stomach. Normal heart size, mediastinal contours, and pulmonary vascularity. Minimal peribronchial thickening. Lungs clear. No pleural effusion or pneumothorax. Bones unremarkable. IMPRESSION: Tube positions as above. Minimal bronchitic changes without infiltrate.  Electronically Signed   By: Ulyses Southward M.D.   On: 06/03/2015 17:32   I have personally reviewed and evaluated these images and lab results as part of my medical decision-making.   EKG Interpretation   Date/Time:  Tuesday June 03 2015 17:09:32 EST Ventricular Rate:  167 PR Interval:  115 QRS Duration: 94 QT Interval:  342 QTC Calculation: 570 R Axis:   92 Text Interpretation:  Sinus tachycardia LAE, consider biatrial enlargement  Borderline right axis deviation Probable left ventricular hypertrophy  Anterior Q waves, possibly due to LVH Abnormal T, consider ischemia,  inferior leads Prolonged QT interval Baseline wander in lead(s) II III aVL  aVF V3 V4 V5 ST depression in Anterolateral leads When compared with ECG  of 05/17/1999, HEART RATE has increased ST-t abnormality is now Present -  probably rate-related Confirmed by [facility]  MD, DAVID ([id number]) on 06/03/2015  5:32:17 PM      MDM   Final diagnoses:  None    Patient is a 40 year old man with history of recent subdural hematoma who presents via EMS for evaluation treatment of suspected seizure activity at home. This was witnessed by his mom who stated that he had generalized shaking and foaming from the mouth.  Upon arrival patient temperature 102.4, heart rate 168. He was agitated, combative and unable to follow any commands. Immediately intubated patient for airway protection and to obtain completion of workup. We had concerns about seizure activity along with worsening of his subdural hematoma. Infectious etiologies on the differential as well as patient was febrile and had a lactate of over 15. I suspect that elevated lactate and temperature was due to prolonged seizure activity however patient covered broadly with vancomycin and Zosyn pending blood culture, urine culture results.  He was intubated without difficulty and was sedated with propofol, fentanyl and intermittent Ativan. He was loaded with Dilantin.  CT head  showed worsening of his subdural hematoma with increasing midline shift. I discussed with neurosurgery attending on call to reviewed images and not feel that this increase was significant or surgical. He recommended loaded with Keppra as well.  I discussed patient with neurology who saw patient at bedside. He was admitted to intensive care for vent management, status epilepticus, worsening subdural hematoma.  I updated patient's mother on current situation and plan of care at bedside. All questions answered.  Patient admitted to the ICU in serious condition with no further events.  Discussed case with my attending Dr. Preston Fleeting.    Dan Humphreys, MD 06/04/15 6045  Dione Booze, MD 06/04/15 2250

## 2015-06-04 ENCOUNTER — Inpatient Hospital Stay (HOSPITAL_COMMUNITY): Payer: Medicaid Other

## 2015-06-04 DIAGNOSIS — J9601 Acute respiratory failure with hypoxia: Secondary | ICD-10-CM | POA: Diagnosis present

## 2015-06-04 LAB — BASIC METABOLIC PANEL
Anion gap: 9 (ref 5–15)
BUN: <5 mg/dL — ABNORMAL LOW (ref 6–20)
CO2: 18 mmol/L — ABNORMAL LOW (ref 22–32)
Calcium: 8.3 mg/dL — ABNORMAL LOW (ref 8.9–10.3)
Chloride: 111 mmol/L (ref 101–111)
Creatinine, Ser: 0.95 mg/dL (ref 0.61–1.24)
GFR calc Af Amer: >60 mL/min (ref >60)
GFR calc non Af Amer: >60 mL/min (ref >60)
Glucose, Bld: 102 mg/dL — ABNORMAL HIGH (ref 65–99)
Potassium: 3.3 mmol/L — ABNORMAL LOW (ref 3.5–5.1)
Sodium: 138 mmol/L (ref 135–145)

## 2015-06-04 LAB — MAGNESIUM: Magnesium: 1.8 mg/dL (ref 1.7–2.4)

## 2015-06-04 LAB — CK: Total CK: 779 U/L — ABNORMAL HIGH (ref 49–397)

## 2015-06-04 LAB — PHENYTOIN LEVEL, TOTAL: Phenytoin Lvl: 14.7 ug/mL (ref 10.0–20.0)

## 2015-06-04 LAB — LACTIC ACID, PLASMA: Lactic Acid, Venous: 1.2 mmol/L (ref 0.5–2.0)

## 2015-06-04 LAB — PROCALCITONIN: Procalcitonin: 4.75 ng/mL

## 2015-06-04 LAB — PHOSPHORUS: Phosphorus: 1.8 mg/dL — ABNORMAL LOW (ref 2.5–4.6)

## 2015-06-04 LAB — TROPONIN I
Troponin I: 0.14 ng/mL — ABNORMAL HIGH (ref <0.031)
Troponin I: 0.08 ng/mL — ABNORMAL HIGH (ref <0.031)

## 2015-06-04 MED ORDER — PIPERACILLIN-TAZOBACTAM 3.375 G IVPB
3.3750 g | Freq: Three times a day (TID) | INTRAVENOUS | Status: DC
  Administered 2015-06-04 – 2015-06-05 (×3): 3.375 g via INTRAVENOUS
  Filled 2015-06-04 (×5): qty 50

## 2015-06-04 MED ORDER — DEXMEDETOMIDINE HCL IN NACL 200 MCG/50ML IV SOLN
0.4000 ug/kg/h | INTRAVENOUS | Status: DC
  Administered 2015-06-04: 0.8 ug/kg/h via INTRAVENOUS
  Administered 2015-06-04: 0.4 ug/kg/h via INTRAVENOUS
  Administered 2015-06-04 – 2015-06-05 (×2): 0.8 ug/kg/h via INTRAVENOUS
  Filled 2015-06-04 (×5): qty 50

## 2015-06-04 MED ORDER — SODIUM PHOSPHATE 3 MMOLE/ML IV SOLN
20.0000 mmol | Freq: Once | INTRAVENOUS | Status: AC
  Administered 2015-06-04: 20 mmol via INTRAVENOUS
  Filled 2015-06-04: qty 6.67

## 2015-06-04 MED ORDER — VANCOMYCIN HCL IN DEXTROSE 750-5 MG/150ML-% IV SOLN
750.0000 mg | Freq: Three times a day (TID) | INTRAVENOUS | Status: DC
  Administered 2015-06-04 – 2015-06-05 (×3): 750 mg via INTRAVENOUS
  Filled 2015-06-04 (×5): qty 150

## 2015-06-04 MED ORDER — MAGNESIUM SULFATE 2 GM/50ML IV SOLN
2.0000 g | Freq: Once | INTRAVENOUS | Status: AC
  Administered 2015-06-04: 2 g via INTRAVENOUS
  Filled 2015-06-04: qty 50

## 2015-06-04 MED ORDER — SODIUM CHLORIDE 0.9 % IV SOLN
200.0000 mg | Freq: Three times a day (TID) | INTRAVENOUS | Status: DC
  Administered 2015-06-04 – 2015-06-06 (×6): 200 mg via INTRAVENOUS
  Filled 2015-06-04 (×11): qty 4

## 2015-06-04 MED ORDER — VANCOMYCIN HCL 10 G IV SOLR: 1500.0000 mg | Freq: Once | INTRAVENOUS | Status: DC

## 2015-06-04 MED ORDER — CHLORHEXIDINE GLUCONATE 0.12% ORAL RINSE (MEDLINE KIT)
15.0000 mL | Freq: Two times a day (BID) | OROMUCOSAL | Status: DC
  Administered 2015-06-04: 15 mL via OROMUCOSAL

## 2015-06-04 MED ORDER — FENTANYL BOLUS VIA INFUSION: 50.0000 ug | INTRAVENOUS | Status: DC | PRN

## 2015-06-04 MED ORDER — DEXAMETHASONE SODIUM PHOSPHATE 10 MG/ML IJ SOLN
10.0000 mg | Freq: Four times a day (QID) | INTRAMUSCULAR | Status: DC
  Administered 2015-06-04 – 2015-06-06 (×8): 10 mg via INTRAVENOUS
  Filled 2015-06-04 (×8): qty 1

## 2015-06-04 MED ORDER — POTASSIUM CHLORIDE 10 MEQ/100ML IV SOLN
10.0000 meq | INTRAVENOUS | Status: AC
  Administered 2015-06-04 (×2): 10 meq via INTRAVENOUS
  Filled 2015-06-04 (×2): qty 100

## 2015-06-04 MED ORDER — FENTANYL CITRATE (PF) 100 MCG/2ML IJ SOLN: 50.0000 ug | INTRAMUSCULAR | Status: DC | PRN

## 2015-06-04 MED ORDER — ANTISEPTIC ORAL RINSE SOLUTION (CORINZ)
7.0000 mL | OROMUCOSAL | Status: DC
  Administered 2015-06-04 (×6): 7 mL via OROMUCOSAL

## 2015-06-04 MED ORDER — PIPERACILLIN-TAZOBACTAM 3.375 G IVPB 30 MIN: 3.3750 g | Freq: Once | INTRAVENOUS | Status: DC

## 2015-06-04 MED ORDER — PHENOL 1.4 % MT LIQD
1.0000 | OROMUCOSAL | Status: DC | PRN
  Filled 2015-06-04: qty 177

## 2015-06-04 NOTE — Progress Notes (Signed)
[facility] ADULT ICU REPLACEMENT PROTOCOL FOR AM LAB REPLACEMENT ONLY  The patient does apply for the [facility] Adult ICU Electrolyte Replacment Protocol based on the criteria listed below:   1. Is GFR >/= 40 ml/min? Yes.    Patient's GFR today is >60 2. Is urine output >/= 0.5 ml/kg/hr for the last 6 hours? Yes.   Patient's UOP is 2.75 ml/kg/hr 3. Is BUN < 60 mg/dL? Yes.    Patient's BUN today is <5 4. Abnormal electrolyte(s):K3.3,Mg1.8, Phos1.8 5. Ordered repletion with: Per protocol 6. If a panic level lab has been reported, has the CCM MD in charge been notified? Yes.  .   Physician:  Laural Benes, MD  [place] Nakayama 06/04/2015 6:28 AM

## 2015-06-04 NOTE — Procedures (Signed)
Extubation Procedure Note  Patient Details:   Name: Jarrette Dehner DOB: 1975/11/11 MRN: [id number]   Airway Documentation:     Evaluation  O2 sats: stable throughout Complications: No apparent complications Patient did tolerate procedure well. Bilateral Breath Sounds: Clear, Diminished Suctioning: Oral, Airway Yes   Pt extubation order given to RN verbally. RN will place the extubation order in Epic.  Positive cuff leak noted prior to extubation. Pt placed on nasal cannula 4 Lpm with humidity. No stridor noted.  Forest Becker Madysun Thall 06/04/2015, 5:06 PM

## 2015-06-04 NOTE — H&P (Signed)
PULMONARY / CRITICAL CARE MEDICINE   Name: Michael Oneill MRN: [id number] DOB: [birth year]    ADMISSION DATE:  06/03/2015 CONSULTATION DATE:  06/03/2015  REFERRING MD:  Dione Booze, MD  CHIEF COMPLAINT:  Altered Mental Status  BRIEF  Michael Oneill is a 40 year old with PMH significant for TBI. Status post previous right-sided craniotomy for evacuation of subdural hematoma approximately 20 years ago.  Discharged from hospital yesterday(2/13) after having a subdural hematoma on 2/11. At that time patient presented with a history of progressive headache with some left sided numbness and subjective weakness. He had hit his head a couple days prior. No loss of consciousness associated with this. Was not taking anticoagulants. No history of seizures. Neurosurgery was following and stated subdural was stable so discharged home with outpatient follow-up. He was brought into ED today by EMS. Found down by his mother after possible seizure like activity. On EMS arrival was found supine, diaphoretic and unresponsive. In ED patient noted to be confused and combative. GCS scale 8 on arrival. He had elevated temperatures. Required intubation in ED for combativeness.    STUDIES:  CXR 2/14>> normal CT head 2/14>> worsening right subdural hematoma. Increased shift  CULTURES: Blood 2/14>> Urine 2/14>>  ANTIBIOTICS: S/p vanc and zoysn in ED x1 dose  SIGNIFICANT EVENTS: 2/11>> right-sided subacute subdural hematoma  2/14 presented to ED after seizure-like activity; increase in size of subdural  LINES/TUBES: ETT 2/14>>   EVENTS 2/14-  readmitted with apparent seizure activity c/b lactic acidosis and ARF.  Required sedation and ETT in ED due combativeness. Head Ct showed interval increase in SDH. On my eval he is sedated on propofol, intubated, without any evidence continued seizures. NSGY recommends following, Neuro has loaded on anti-epileptics. Lactate is clearing rapidly, suspect due to  seizure activity. We will keep him sedated overnight, assess for possible WUA and extubation on 2/15. Follow NSGY and neuro recs. Will check CPK with renal failure after seizures   SUBJECTIVE/OVERNIGHT/INTERVAL HX  06/04/15  - Agitated on diprivan and fent gtt but does follow command    VITAL SIGNS: BP 137/90 mmHg  Pulse 49  Temp(Src) 96 F (35.6 C) (Axillary)  Resp 18  Ht  (1.778 m)  Wt 69.5 kg (153 lb 3.5 oz)  BMI 21.98 kg/m2  SpO2 100%  HEMODYNAMICS:    VENTILATOR SETTINGS: Vent Mode:  [-] PRVC FiO2 (%):  [40 %-100 %] 40 % Set Rate:  [18 bmp] 18 bmp Vt Set:  [590 mL] 590 mL PEEP:  [5 cmH20] 5 cmH20 Plateau Pressure:  [14 cmH20-16 cmH20] 15 cmH20  INTAKE / OUTPUT: I/O last 3 completed shifts: In: 3476.7 [I.V.:3376.7; IV Piggyback:100] Out: 1850 [Urine:1850]  PHYSICAL EXAMINATION: General:  Well-appearing AAM, agitated witht Neuro:  Intermittent RASS +2 on fent and diprivan gtt HEENT:  EET, [place]/AT, no scleral icterus, PERRL Cardiovascular:  RRR, no m/r/g Lungs: CTAB, no wheezes, rhonchi Abdomen:  Soft, non-distend, +BS, no masses Musculoskeletal:  Moves all extremities, no edema Skin:  Warm and dry, intact  LABS: PULMONARY  Recent Labs Lab 05/31/15 1139 06/03/15 2340  PHART  --  7.467*  PCO2ART  --  25.0*  PO2ART  --  292*  HCO3  --  17.8*  TCO2 27 18.6  O2SAT  --  99.8    CBC  Recent Labs Lab 05/31/15 1137 05/31/15 1139 06/03/15 1710 06/04/15 0214  HGB 13.1 15.0 14.6 10.7*  HCT 38.1* 44.0 42.9 31.3*  WBC 7.0  --  13.1* 6.1  PLT 258  --  358 209    COAGULATION  Recent Labs Lab 05/31/15 1137  INR 1.02    CARDIAC   Recent Labs Lab 06/03/15 2058 06/04/15 0214 06/04/15 1000  TROPONINI 0.12* 0.14* 0.08*   No results for input(s): PROBNP in the last 168 hours.   CHEMISTRY  Recent Labs Lab 05/31/15 1137 05/31/15 1139 06/03/15 1710 06/04/15 0214  NA 136 137 136 138  K 4.5 4.2 4.7 3.3*  CL 100* 99* 100* 111  CO2 25   --  11* 18*  GLUCOSE 99 98 174* 102*  BUN <5* 6 8 <5*  CREATININE 1.03 1.20 1.67* 0.95  CALCIUM 9.2  --  10.3 8.3*  MG  --   --   --  1.8  PHOS  --   --   --  1.8*   Estimated Creatinine Clearance: 100.6 mL/min (by C-G formula based on Cr of 0.95).   LIVER  Recent Labs Lab 05/31/15 1137 06/03/15 1710  AST 30 32  ALT 16* 10*  ALKPHOS 71 63  BILITOT 0.6 0.6  PROT 7.0 7.8  ALBUMIN 4.2 4.1  INR 1.02  --      INFECTIOUS  Recent Labs Lab 06/03/15 1720 06/03/15 2058 06/03/15 2101 06/04/15 0214  LATICACIDVEN 15.21*  --  3.94* 1.2  PROCALCITON  --  3.97  --  4.75     ENDOCRINE CBG (last 3)   Recent Labs  06/03/15 2326  GLUCAP 100*         IMAGING x48h  - image(s) personally visualized  -   highlighted in bold Ct Head Wo Contrast  06/03/2015  CLINICAL DATA:  Recent discharge for improving closed head injury, now with worsening mental status. EXAM: CT HEAD WITHOUT CONTRAST TECHNIQUE: Contiguous axial images were obtained from the base of the skull through the vertex without intravenous contrast. COMPARISON:  CT head 05/31/15 and 06/01/2015. FINDINGS: The patient has developed increasing mass effect from an extra-axial collection on the RIGHT. The acute component, located over the posterior lateral temporal lobe on the RIGHT continues to involute, but there is increasing low to medium attenuation fluid, CONSISTENT WITH SUBACUTE SUBDURAL HEMATOMA, over the RIGHT frontal and posterior frontal convexity, and measuring up to 10 mm thickness on image 23 series 2. Right-to-left shift is also increasing at 8 mm and there is mild rotation of the brainstem and effacement of the suprasellar cistern due to uncal displacement. Evidence for chronic RIGHT frontotemporal parietal craniotomy, without acute skull fracture. No pneumocephalus. No parenchymal hemorrhage. IMPRESSION: Worsening late acute to early subacute RIGHT subdural hematoma. Increasing right-to-left shift of 8 mm.  Increasing multiple prominence into the suprasellar cistern and brainstem. These results were called by telephone at the time of interpretation on 06/03/2015 at 7:00 pm to Dr. Preston Fleeting, who verbally acknowledged these results. Electronically Signed   By: Elsie Stain M.D.   On: 06/03/2015 19:06   Dg Chest Port 1 View  06/04/2015  CLINICAL DATA:  Respiratory failure, smoker EXAM: PORTABLE CHEST 1 VIEW COMPARISON:  Portable exam 0631 hours compared to 06/03/2015 FINDINGS: Tip of endotracheal tube projects 3.3 cm above carina. Nasogastric tube coiled in proximal stomach. Normal heart size, mediastinal contours, and pulmonary vascularity. Lungs clear. No pleural effusion or pneumothorax. Bones unremarkable. IMPRESSION: No acute abnormalities. Electronically Signed   By: Ulyses Southward M.D.   On: 06/04/2015 07:57   Dg Chest Portable 1 View  06/03/2015  CLINICAL DATA:  Intubation, possible seizure, history of traumatic brain injury, smoker EXAM: PORTABLE CHEST 1  VIEW COMPARISON:  Portable exam 1714 hours compared to 03/01/2013 FINDINGS: Tip of endotracheal tube projects 3.9 cm above carina. Nasogastric tube extends into stomach. Normal heart size, mediastinal contours, and pulmonary vascularity. Minimal peribronchial thickening. Lungs clear. No pleural effusion or pneumothorax. Bones unremarkable. IMPRESSION: Tube positions as above. Minimal bronchitic changes without infiltrate. Electronically Signed   By: Ulyses Southward M.D.   On: 06/03/2015 17:32      DISCUSSION: Michael Oneill is a 40 y.o. man  ASSESSMENT / PLAN:  PULMONARY A: No primary pulmonary process, CXR with no infiltrates  Acute respiratory failure due to LOC, Intubated due to combativeness and ?post-ictal state  - agitated   P:   Try precedex and aim for extubation aafter SBT  CARDIOVASCULAR A:  No acute issues  P:  Monitor  RENAL A:   AKI due to Dehydration - UA with hyaline cast, elevated Uspec, and ketones Acute AG metabolic  acidosis,   - resolved AKI but low mag and low phos  P:   IVF NS @75mL  Avoid nephrotoxic medications BMP in AM with hydration Consider rhabdo in setting seizures > check CPK Monitor I/Os Consider renal US if not improving  GASTROINTESTINAL A:   No acute issues  P:   NPO SPU: protonix SCDs  HEMATOLOGIC A:   Leukocytosis - need to r/o infectious source; possibly 2/2 ?seizure  P:  CBC in AM Hold off on anticoagulation in setting of bleed  INFECTIOUS A:   No source of infection - UA w/o signs of infection, CXR normal but PCT higth Febrile on admission Lactic acid elevated - most likely 2/2 ?seizure  P:   Follow-up cultures restart vanc/zoysn due to high PCT Pct collected  trend lactic acid  ENDOCRINE A:   No acute issues  P:     NEUROLOGIC A:   Acute encephalopathy ?new-onset seizures - no h/o seizure, witnessed seizure-liek activity at home Subdural hematoma - increased from prior study H/o TBI - agitated  P:   RASS goal: -1 Fentanyl and propofol gtt Neurology and neurosurgery following  EEG Continue keppra and dilantin UDS pending Restraints prn for combativeness   FAMILY  - Updates: No family at bedside 06/04/15 - Inter-disciplinary family meet or Palliative Care meeting due by:  2/21   GLOBAL 2/15 - aim to extubate on precedex. MD has initiated PSV   The patient is critically ill with multiple organ systems failure and requires high complexity decision making for assessment and support, frequent evaluation and titration of therapies, application of advanced monitoring technologies and extensive interpretation of multiple databases.   Critical Care Time devoted to patient care services described in this note is  30  Minutes. This time reflects time of care of this signee Dr Kalman Shan. This critical care time does not reflect procedure time, or teaching time or supervisory time of PA/NP/Med student/Med Resident etc but could involve  care discussion time    Dr. Kalman Shan, M.D., [facility].C.P Pulmonary and Critical Care Medicine Staff Physician [facility] Pager: [phone], If no answer or between  15:00h - 7:00h: call [phone]  06/04/2015 12:01 PM

## 2015-06-04 NOTE — Progress Notes (Signed)
Initial Nutrition Assessment  INTERVENTION:   If patient remains intubated, recommend initiating tube feeding with Vital AF 1.2 at 25 mL/hr to increase by 10 mL/hr to a goal rate of 65 mL/hour.    Tube feeding regimen provides 1872 total kcals, 117 grams of protein and 1265 mL of water.   NUTRITION DIAGNOSIS:   Inadequate oral intake related to inability to eat as evidenced by NPO status  GOAL:   Patient will meet greater than or equal to 90% of their needs  MONITOR:   Vent status, I & O's  REASON FOR ASSESSMENT:   Ventilator    ASSESSMENT:   PMH significant for TBI. Discharged 2/13 after having a subdural hematoma on 2/11. Readmitted 2/14 with possible seizure like activity.  CT shows worsening late acute to early subacute R subdural hematoma.   Patient is currently intubated on ventilator support. MV: 10.7 mL/min Temp (24 hours), Avg: 96.9 F (36 C), Min: 95.9 F (35.5 C), Max: 98.6 F (37 C) OG tube, stomach  Propofol off.  Per nurse, attempting to extubate today.  Nutrition Focused Physical Exam was completed.  Findings include no fat depletion, mild muscle depletion in the lower extremeties, and no edema.   Medications reviewed.  Labs reviewed: potassium low 3.3, BUN low <5, phosphorus low 1.8, magnesium 1.8, CBGs 100.  Pt discussed during ICU rounds and with RN.   Diet Order:  Diet NPO time specified  Skin:  Reviewed, no issues  Last BM:  unknown  Height:   Ht Readings from Last 1 Encounters:  06/03/15  (1.778 m)    Weight:   Wt Readings from Last 1 Encounters:  06/04/15 153 lb 3.5 oz (69.5 kg)    Ideal Body Weight:  75.5 kg  BMI:  Body mass index is 21.98 kg/(m^2).  Estimated Nutritional Needs:   Kcal:  1843  Protein:  100-115 grams  Fluid:  >/= 2L  EDUCATION NEEDS:   No education needs identified at this time  Doroteo Glassman, Dietetic Intern Pager: [phone]

## 2015-06-04 NOTE — Progress Notes (Signed)
Patient readmitted with generalized seizure. Intubated for airway control.  Sedated on ventilator. We'll open eyes. Right-sided gaze preference. Flexes both upper extremities.  Follow-up head CT scan demonstrates right convexity subacute subdural with underlying cerebral edema.  Status post generalized seizure secondary to cerebral irritation from his subacute subdural hematoma. Still do not feel there are indications for operative evacuation present. At Decadron and antiepileptic medications for seizure control. Work towards weaning from ventilator and extubating today.

## 2015-06-04 NOTE — Progress Notes (Signed)
Pharmacy Antibiotic Note  Michael Oneill is a 40 y.o. male admitted on 06/03/2015 with sepsis.  Pharmacy has been consulted for vancomycin and zosyn dosing.  Plan: Vancomycin 750 IV every 8 hours.  Goal trough 15-20 mcg/mL. Zosyn 3.375g IV q8h (4 hour infusion).  Height:  (177.8 cm) Weight: 153 lb 3.5 oz (69.5 kg) IBW/kg (Calculated) : 73  Temp (24hrs), Avg:98.6 F (37 C), Min:95.9 F (35.5 C), Max:102.4 F (39.1 C)   Recent Labs Lab 05/31/15 1137 05/31/15 1139 06/03/15 1710 06/03/15 1720 06/03/15 2101 06/04/15 0214  WBC 7.0  --  13.1*  --   --  6.1  CREATININE 1.03 1.20 1.67*  --   --  0.95  LATICACIDVEN  --   --   --  15.21* 3.94* 1.2    Estimated Creatinine Clearance: 100.6 mL/min (by C-G formula based on Cr of 0.95).    No Known Allergies  Antimicrobials this admission: Vanc 2/15>> Zosyn 2/15>>  Dose adjustments this admission: N/A  Microbiology results: 2/14 Blood x 2 2/14 Urine  Isaac Bliss, PharmD, BCPS, [facility] Clinical Pharmacist Pager [phone] 06/04/2015 12:56 PM

## 2015-06-04 NOTE — Procedures (Addendum)
History: 40 yo M with SDH   Sedation: Propofol  Technique: This is a 21 channel routine scalp EEG performed at the bedside with bipolar and monopolar montages arranged in accordance to the international 10/20 system of electrode placement. One channel was dedicated to EKG recording.    Background: The background consists of predominantly beta range activities with some low voltage irregular diffuse delta as well. There are occasional structures resembling sleep spindles seen a ttimes.   Photic stimulation: Physiologic driving is not performed  EEG Abnormalities: 1) Generalized irregular slow activity.   Clinical Interpretation: This EEG is consistent with a generalized non-specific cerebral dysfunction(encephalopathy) as would be expected with sedating medication such as propofol. There was no seizure or seizure predisposition recorded on this study. Please note that a normal EEG does not preclude the possibility of epilepsy.   Ritta Slot, MD [facility] [phone]  If 7pm- 7am, please page neurology on call as listed in AMION.

## 2015-06-04 NOTE — Progress Notes (Signed)
Interval History:                                                                                                                      Michael Oneill is an 40 y.o. male patient who presented with sz. H/o SDH recently, s/p evacuation.   On keppra and dilantin. No further sz since admission. Drowsy.  EEG showed no sz or abn discharges.    Past Medical History: Past Medical History  Diagnosis Date  . TBI (traumatic brain injury) (HCC) 1997    Past Surgical History  Procedure Laterality Date  . Brain surgery      Family History: No family history on file.  Social History:   reports that he has been smoking.  He does not have any smokeless tobacco history on file. He reports that he drinks alcohol. He reports that he does not use illicit drugs.  Allergies:  No Known Allergies   Medications:                                                                                                                         Current facility-administered medications:  .  0.9 %  sodium chloride infusion, , Intravenous, Continuous, Rahul P Desai, PA-C, Last Rate: 75 mL/hr at 06/03/15 2300 .  0.9 %  sodium chloride infusion, 250 mL, Intravenous, PRN, Rahul P Desai, PA-C .  antiseptic oral rinse solution (CORINZ), 7 mL, Mouth Rinse, 10 times per day, Leslye Peer, MD, 7 mL at 06/04/15 0933 .  chlorhexidine gluconate (PERIDEX) 0.12 % solution 15 mL, 15 mL, Mouth Rinse, BID, Leslye Peer, MD, 15 mL at 06/04/15 0840 .  dexamethasone (DECADRON) injection 10 mg, 10 mg, Intravenous, 4 times per day, Julio Sicks, MD .  fentaNYL (SUBLIMAZE) 2,500 mcg in sodium chloride 0.9 % 250 mL (10 mcg/mL) infusion, 25-400 mcg/hr, Intravenous, Continuous, Rahul P Desai, PA-C, Last Rate: 5 mL/hr at 06/04/15 0900, 50 mcg/hr at 06/04/15 0900 .  fentaNYL (SUBLIMAZE) bolus via infusion 50 mcg, 50 mcg, Intravenous, Q1H PRN, Rahul P Desai, PA-C .  fentaNYL (SUBLIMAZE) injection 50 mcg, 50 mcg, Intravenous, Once, Rahul P Desai,  PA-C, Stopped at 06/03/15 2129 .  pantoprazole (PROTONIX) injection 40 mg, 40 mg, Intravenous, QHS, Rahul P Desai, PA-C, 40 mg at 06/03/15 2156 .  phenytoin (DILANTIN) 200 mg in sodium chloride 0.9 % 100 mL IVPB, 200 mg, Intravenous, 3 times per day, Julio Sicks, MD .  propofol (DIPRIVAN) 1000 MG/100ML infusion,  0-50 mcg/kg/min, Intravenous, Continuous, Rahul P Desai, PA-C, Last Rate: 15.6 mL/hr at 06/04/15 0900, 40 mcg/kg/min at 06/04/15 0900 .  sodium phosphate 20 mmol in sodium chloride 0.9 % 250 mL infusion, 20 mmol, Intravenous, Once, Karl Ito, MD, 20 mmol at 06/04/15 0933   Neurologic Examination:                                                                                                      Blood pressure 137/90, pulse 49, temperature 96 F (35.6 C), temperature source Axillary, resp. rate 18, height  (1.778 m), weight 69.5 kg (153 lb 3.5 oz), SpO2 100 %.  Evaluation of higher integrative functions including: Level of alertness: drowsy, follows commands. Fluent speech.   Test the following cranial nerves: 2-12 grossly intact  full 5/5 motor strength in all 4 extremities. No tremors noted.      Lab Results: Basic Metabolic Panel:  Recent Labs Lab 05/31/15 1137 05/31/15 1139 06/03/15 1710 06/04/15 0214  NA 136 137 136 138  K 4.5 4.2 4.7 3.3*  CL 100* 99* 100* 111  CO2 25  --  11* 18*  GLUCOSE 99 98 174* 102*  BUN <5* 6 8 <5*  CREATININE 1.03 1.20 1.67* 0.95  CALCIUM 9.2  --  10.3 8.3*  MG  --   --   --  1.8  PHOS  --   --   --  1.8*    Liver Function Tests:  Recent Labs Lab 05/31/15 1137 06/03/15 1710  AST 30 32  ALT 16* 10*  ALKPHOS 71 63  BILITOT 0.6 0.6  PROT 7.0 7.8  ALBUMIN 4.2 4.1   No results for input(s): LIPASE, AMYLASE in the last 168 hours. No results for input(s): AMMONIA in the last 168 hours.  CBC:  Recent Labs Lab 05/31/15 1137 05/31/15 1139 06/03/15 1710 06/04/15 0214  WBC 7.0  --  13.1* 6.1  NEUTROABS 3.5  --  5.4   --   HGB 13.1 15.0 14.6 10.7*  HCT 38.1* 44.0 42.9 31.3*  MCV 93.6  --  97.5 92.1  PLT 258  --  358 209    Cardiac Enzymes:  Recent Labs Lab 06/03/15 2058 06/04/15 0214  TROPONINI 0.12* 0.14*    Lipid Panel:  Recent Labs Lab 06/03/15 1710  TRIG 136    CBG:  Recent Labs Lab 06/03/15 2326  GLUCAP 100*    Microbiology: Results for orders placed or performed during the hospital encounter of 05/31/15  MRSA PCR Screening     Status: None   Collection Time: 05/31/15  5:16 PM  Result Value Ref Range Status   MRSA by PCR NEGATIVE NEGATIVE Final    Comment:        The GeneXpert MRSA Assay (FDA approved for NASAL specimens only), is one component of a comprehensive MRSA colonization surveillance program. It is not intended to diagnose MRSA infection nor to guide or monitor treatment for MRSA infections.     Imaging: Ct Head Wo Contrast  06/03/2015  CLINICAL DATA:  Recent discharge for improving closed  head injury, now with worsening mental status. EXAM: CT HEAD WITHOUT CONTRAST TECHNIQUE: Contiguous axial images were obtained from the base of the skull through the vertex without intravenous contrast. COMPARISON:  CT head 05/31/15 and 06/01/2015. FINDINGS: The patient has developed increasing mass effect from an extra-axial collection on the RIGHT. The acute component, located over the posterior lateral temporal lobe on the RIGHT continues to involute, but there is increasing low to medium attenuation fluid, CONSISTENT WITH SUBACUTE SUBDURAL HEMATOMA, over the RIGHT frontal and posterior frontal convexity, and measuring up to 10 mm thickness on image 23 series 2. Right-to-left shift is also increasing at 8 mm and there is mild rotation of the brainstem and effacement of the suprasellar cistern due to uncal displacement. Evidence for chronic RIGHT frontotemporal parietal craniotomy, without acute skull fracture. No pneumocephalus. No parenchymal hemorrhage. IMPRESSION:  Worsening late acute to early subacute RIGHT subdural hematoma. Increasing right-to-left shift of 8 mm. Increasing multiple prominence into the suprasellar cistern and brainstem. These results were called by telephone at the time of interpretation on 06/03/2015 at 7:00 pm to Dr. Preston Fleeting, who verbally acknowledged these results. Electronically Signed   By: Elsie Stain M.D.   On: 06/03/2015 19:06   Ct Head Without Contrast  06/01/2015  CLINICAL DATA:  Follow-up subdural hematoma.  Initial encounter. EXAM: CT HEAD WITHOUT CONTRAST TECHNIQUE: Contiguous axial images were obtained from the base of the skull through the vertex without intravenous contrast. COMPARISON:  CT of the head performed 05/31/2015 FINDINGS: The patient's acute right-sided subdural hematoma is relatively stable in size, measuring 1.0 cm anteriorly and 1.2 cm along the posterior aspect of the right temporal lobe. Underlying subacute hematoma is seen. Approximately 6 mm of leftward midline shift is seen, relatively stable in appearance. The brainstem and fourth ventricle are within normal limits. The basal ganglia are unremarkable in appearance. Slight asymmetric prominence of the left lateral ventricle likely reflects mild mass effect on the right lateral ventricle, without definite evidence of hydrocephalus. There is no evidence of fracture; right-sided postoperative change is noted. The visualized portions of the orbits are within normal limits. The paranasal sinuses and mastoid air cells are well-aerated. No significant soft tissue abnormalities are seen. IMPRESSION: 1. Acute right-sided subdural hematoma is relatively stable, measuring 1.0 cm anteriorly and 1.2 cm along the posterior aspect of the right temporal lobe. Approximately 6 mm of leftward midline shift again noted. 2. Mild mass-effect on the right lateral ventricle likely explains slight asymmetric prominence of the left lateral ventricle, without definite evidence of hydrocephalus.  Electronically Signed   By: Roanna Raider M.D.   On: 06/01/2015 06:23   Ct Head Wo Contrast  05/31/2015  CLINICAL DATA:  Right-sided headache for 1 week with facial numbness and blurred vision. EXAM: CT HEAD WITHOUT CONTRAST TECHNIQUE: Contiguous axial images were obtained from the base of the skull through the vertex without intravenous contrast. COMPARISON:  Report of CT head without contrast 05/11/1999. FINDINGS: An acute right extra-axial fluid collection is present. This extends over the right convexity. Areas of hyperdense bladder most evident adjacent to the right parietal and temporal lobe extending into the right middle cranial fossa and more anteriorly over the anterior right frontal convexity. There is mass effect with effacement of the sulci and crowding of the cortex. Midline shift measures 6 mm at the foramen of Monro. No cortical infarct is present. There is no left-sided hemorrhage. There is partial effacement of the right lateral ventricle. Posterior fossa structures are intact. A  remote right-sided craniotomy is again noted. The calvarium is otherwise intact. No significant extracranial soft tissue lesions are present. The globes and orbits are within normal limits. The paranasal sinuses and mastoid air cells are clear. IMPRESSION: 1. Acute/subacute right subdural hematoma with mass effect and midline shift as described. The maximum width of the hemorrhage is 8 mm. 2. 6 mm midline shift at the foramen of Monro. These results were called by telephone at the time of interpretation on 05/31/2015 at 2:45 PM to Dr. Charlestine Night , who verbally acknowledged these results. Electronically Signed   By: Marin Roberts M.D.   On: 05/31/2015 15:03   Dg Chest Port 1 View  06/04/2015  CLINICAL DATA:  Respiratory failure, smoker EXAM: PORTABLE CHEST 1 VIEW COMPARISON:  Portable exam 0631 hours compared to 06/03/2015 FINDINGS: Tip of endotracheal tube projects 3.3 cm above carina. Nasogastric tube  coiled in proximal stomach. Normal heart size, mediastinal contours, and pulmonary vascularity. Lungs clear. No pleural effusion or pneumothorax. Bones unremarkable. IMPRESSION: No acute abnormalities. Electronically Signed   By: Ulyses Southward M.D.   On: 06/04/2015 07:57   Dg Chest Portable 1 View  06/03/2015  CLINICAL DATA:  Intubation, possible seizure, history of traumatic brain injury, smoker EXAM: PORTABLE CHEST 1 VIEW COMPARISON:  Portable exam 1714 hours compared to 03/01/2013 FINDINGS: Tip of endotracheal tube projects 3.9 cm above carina. Nasogastric tube extends into stomach. Normal heart size, mediastinal contours, and pulmonary vascularity. Minimal peribronchial thickening. Lungs clear. No pleural effusion or pneumothorax. Bones unremarkable. IMPRESSION: Tube positions as above. Minimal bronchitic changes without infiltrate. Electronically Signed   By: Ulyses Southward M.D.   On: 06/03/2015 17:32    Assessment and plan:   Michael Oneill is an 40 y.o. male patient s/p rt SDH evacuation recently , who presented with sz. On dilantin, and and keppra, stable now, no further sz. EEG showed no sz, no abn discharges.  No new recs from neuro. Sign off.

## 2015-06-05 ENCOUNTER — Inpatient Hospital Stay (HOSPITAL_COMMUNITY): Payer: Medicaid Other

## 2015-06-05 DIAGNOSIS — J9600 Acute respiratory failure, unspecified whether with hypoxia or hypercapnia: Secondary | ICD-10-CM

## 2015-06-05 LAB — CK TOTAL AND CKMB (NOT AT ARMC)
CK, MB: 3.2 ng/mL (ref 0.5–5.0)
Relative Index: 0.5 (ref 0.0–2.5)
Total CK: 635 U/L — ABNORMAL HIGH (ref 49–397)

## 2015-06-05 LAB — CBC
HCT: 31.3 % — ABNORMAL LOW (ref 39.0–52.0)
Hemoglobin: 10.7 g/dL — ABNORMAL LOW (ref 13.0–17.0)
MCH: 31.5 pg (ref 26.0–34.0)
MCHC: 34.2 g/dL (ref 30.0–36.0)
MCV: 92.1 fL (ref 78.0–100.0)
Platelets: 209 10*3/uL (ref 150–400)
RBC: 3.4 MIL/uL — ABNORMAL LOW (ref 4.22–5.81)
RDW: 12.5 % (ref 11.5–15.5)
WBC: 6.1 10*3/uL (ref 4.0–10.5)

## 2015-06-05 LAB — BASIC METABOLIC PANEL
Anion gap: 11 (ref 5–15)
BUN: 6 mg/dL (ref 6–20)
CO2: 18 mmol/L — ABNORMAL LOW (ref 22–32)
Calcium: 8.9 mg/dL (ref 8.9–10.3)
Chloride: 110 mmol/L (ref 101–111)
Creatinine, Ser: 0.9 mg/dL (ref 0.61–1.24)
GFR calc Af Amer: >60 mL/min (ref >60)
GFR calc non Af Amer: >60 mL/min (ref >60)
Glucose, Bld: 142 mg/dL — ABNORMAL HIGH (ref 65–99)
Potassium: 4.7 mmol/L (ref 3.5–5.1)
Sodium: 139 mmol/L (ref 135–145)

## 2015-06-05 LAB — PROCALCITONIN: Procalcitonin: 1.17 ng/mL

## 2015-06-05 LAB — PHOSPHORUS: Phosphorus: 4.8 mg/dL — ABNORMAL HIGH (ref 2.5–4.6)

## 2015-06-05 LAB — URINE CULTURE: Culture: NO GROWTH

## 2015-06-05 LAB — MAGNESIUM: Magnesium: 2 mg/dL (ref 1.7–2.4)

## 2015-06-05 MED ORDER — HYDROCODONE-ACETAMINOPHEN 5-325 MG PO TABS: 1.0000 | ORAL_TABLET | Freq: Four times a day (QID) | ORAL | Status: DC | PRN

## 2015-06-05 MED ORDER — ENSURE ENLIVE PO LIQD
237.0000 mL | Freq: Two times a day (BID) | ORAL | Status: DC
  Administered 2015-06-05 (×2): 237 mL via ORAL

## 2015-06-05 MED ORDER — LEVETIRACETAM 500 MG PO TABS
500.0000 mg | ORAL_TABLET | Freq: Two times a day (BID) | ORAL | Status: DC
  Administered 2015-06-05 – 2015-06-06 (×3): 500 mg via ORAL
  Filled 2015-06-05 (×3): qty 1

## 2015-06-05 NOTE — Progress Notes (Signed)
  Echocardiogram 2D Echocardiogram has been performed.  Michael Oneill 06/05/2015, 10:12 AM

## 2015-06-05 NOTE — Progress Notes (Addendum)
Patient extubated yesterday without difficulty. Patient is awake and alert. He denies headache. He denies numbness or weakness.  Afebrile. Vitals are stable. Patient awake and alert. He is oriented and appropriate. Motor and sensory function are intact. No pronator drift.  Continue antiepileptic medications. Continue Decadron. Continue ICU observation. Follow-up head CT scan in morning. Plan to add oral Keppra for seizure control. Continue IV Dilantin for now. Plan to stop Dilantin tomorrow.

## 2015-06-05 NOTE — Progress Notes (Signed)
Nutrition Follow-up  INTERVENTION:   Ensure Enlive po BID, each supplement provides 350 kcal and 20 grams of protein  NUTRITION DIAGNOSIS:   Inadequate oral intake related to inability to eat as evidenced by NPO status. Progressing.   GOAL:   Patient will meet greater than or equal to 90% of their needs Progressing.   MONITOR:   PO intake, Supplement acceptance, Diet advancement, I & O's, Weight trends  ASSESSMENT:   PMH significant for TBI. Discharged 2/13 after having a subdural hematoma on 2/11. Readmitted 2/14 with possible seizure like activity.  CT shows worsening late acute to early subacute R subdural hematoma.  2/15 pt extubated 2/16 Regular diet just ordered Per pt no recent weight loss, usual weight 145 lb. He does state that his appetite varies. Pt not very talkative but willing to try Ensure.   Diet Order:  Diet clear liquid Room service appropriate?: Yes; Fluid consistency:: Thin  Skin:  Reviewed, no issues  Last BM:  unknown  Height:   Ht Readings from Last 1 Encounters:  06/03/15  (1.778 m)    Weight:   Wt Readings from Last 1 Encounters:  06/05/15 153 lb 3.5 oz (69.5 kg)    Ideal Body Weight:  75.5 kg  BMI:  Body mass index is 21.98 kg/(m^2).  Estimated Nutritional Needs:   Kcal:  1900-2100  Protein:  100-115 grams  Fluid:  >/= 2L  EDUCATION NEEDS:   No education needs identified at this time  Kendell Bane RD, LDN, CNSC [phone] Pager [phone] After Hours Pager

## 2015-06-05 NOTE — Progress Notes (Signed)
eLink Physician-Brief Progress Note Patient Name: Bayan Kushnir DOB: 1975/11/13 MRN: [id number]   Date of Service  06/05/2015  HPI/Events of Note  STable now 8 hours off of precedex In chair, comfortable  eICU Interventions  Transfer to med-surg     Intervention Category Minor Interventions: Communication with other healthcare providers and/or family;Routine modifications to care plan (e.g. PRN medications for pain, fever)  Max Fickle 06/05/2015, 4:50 PM

## 2015-06-05 NOTE — Progress Notes (Signed)
PULMONARY / CRITICAL CARE MEDICINE   Name: Michael Oneill MRN: [id number] DOB: 07-05-1975    ADMISSION DATE:  06/03/2015 CONSULTATION DATE:  06/03/2015  REFERRING MD:  Dione Booze, MD  CHIEF COMPLAINT:  Altered Mental Status  BRIEF  Michael Oneill is a 40 year old with PMH significant for TBI. Status post previous right-sided craniotomy for evacuation of subdural hematoma approximately 20 years ago.  Discharged from hospital yesterday(2/13) after having a subdural hematoma on 2/11. At that time patient presented with a history of progressive headache with some left sided numbness and subjective weakness. He had hit his head a couple days prior. No loss of consciousness associated with this. Was not taking anticoagulants. No history of seizures. Neurosurgery was following and stated subdural was stable so discharged home with outpatient follow-up. He was brought into ED today by EMS. Found down by his mother after possible seizure like activity. On EMS arrival was found supine, diaphoretic and unresponsive. In ED patient noted to be confused and combative. GCS scale 8 on arrival. He had elevated temperatures. Required intubation in ED for combativeness.    STUDIES:  CXR 2/14>> normal CT head 2/14>> worsening right subdural hematoma. Increased shift  CULTURES: Blood 2/14>> Urine 2/14>>  ANTIBIOTICS: S/p vanc and zoysn in ED x1 dose  SIGNIFICANT EVENTS: 2/11>> right-sided subacute subdural hematoma  2/14 presented to ED after seizure-like activity; increase in size of subdural  LINES/TUBES: ETT 2/14>> 2/15    EVENTS 2/14-  readmitted with apparent seizure activity c/b lactic acidosis and ARF.  Required sedation and ETT in ED due combativeness. Head Ct showed interval increase in SDH.   SUBJECTIVE/OVERNIGHT/INTERVAL HX Calm on low dose precedex gtt Afebrile Denies pain    VITAL SIGNS: BP 95/72 mmHg  Pulse 49  Temp(Src) 97.4 F (36.3 C) (Oral)  Resp 13  Ht 5\' 10"   (1.778 m)  Wt 69.5 kg (153 lb 3.5 oz)  BMI 21.98 kg/m2  SpO2 100%  HEMODYNAMICS:    VENTILATOR SETTINGS: Vent Mode:  [-] PSV;CPAP FiO2 (%):  [40 %] 40 % PEEP:  [5 cmH20] 5 cmH20 Pressure Support:  [5 cmH20] 5 cmH20  INTAKE / OUTPUT: I/O last 3 completed shifts: In: 4709.2 [I.V.:3290.5; IV Piggyback:1418.7] Out: 3950 [Urine:3950]  PHYSICAL EXAMINATION: General:  Well-appearing AAM, agitated witht Neuro: awake, interactive, non focal HEENT:   [place]/AT, no scleral icterus, PERRL Cardiovascular:  RRR, no m/r/g Lungs: CTAB, no wheezes, rhonchi Abdomen:  Soft, non-distend, +BS, no masses Musculoskeletal:  Moves all extremities, no edema Skin:  Warm and dry, intact  LABS: PULMONARY  Recent Labs Lab 05/31/15 1139 06/03/15 2340  PHART  --  7.467*  PCO2ART  --  25.0*  PO2ART  --  292*  HCO3  --  17.8*  TCO2 27 18.6  O2SAT  --  99.8    CBC  Recent Labs Lab 05/31/15 1137 05/31/15 1139 06/03/15 1710 06/04/15 0214  HGB 13.1 15.0 14.6 10.7*  HCT 38.1* 44.0 42.9 31.3*  WBC 7.0  --  13.1* 6.1  PLT 258  --  358 209    COAGULATION  Recent Labs Lab 05/31/15 1137  INR 1.02    CARDIAC    Recent Labs Lab 06/03/15 2058 06/04/15 0214 06/04/15 1000  TROPONINI 0.12* 0.14* 0.08*   No results for input(s): PROBNP in the last 168 hours.   CHEMISTRY  Recent Labs Lab 05/31/15 1137 05/31/15 1139 06/03/15 1710 06/04/15 0214 06/05/15 0530  NA 136 137 136 138 139  K 4.5 4.2 4.7 3.3* 4.7  CL 100* 99* 100* 111 110  CO2 25  --  11* 18* 18*  GLUCOSE 99 98 174* 102* 142*  BUN <5* 6 8 <5* 6  CREATININE 1.03 1.20 1.67* 0.95 0.90  CALCIUM 9.2  --  10.3 8.3* 8.9  MG  --   --   --  1.8 2.0  PHOS  --   --   --  1.8* 4.8*   Estimated Creatinine Clearance: 106.2 mL/min (by C-G formula based on Cr of 0.9).   LIVER  Recent Labs Lab 05/31/15 1137 06/03/15 1710  AST 30 32  ALT 16* 10*  ALKPHOS 71 63  BILITOT 0.6 0.6  PROT 7.0 7.8  ALBUMIN 4.2 4.1  INR 1.02   --      INFECTIOUS  Recent Labs Lab 06/03/15 1720 06/03/15 2058 06/03/15 2101 06/04/15 0214 06/05/15 0530  LATICACIDVEN 15.21*  --  3.94* 1.2  --   PROCALCITON  --  3.97  --  4.75 1.17     ENDOCRINE CBG (last 3)   Recent Labs  06/03/15 2326  GLUCAP 100*         IMAGING x48h  - image(s) personally visualized  -   highlighted in bold Ct Head Wo Contrast  06/03/2015  CLINICAL DATA:  Recent discharge for improving closed head injury, now with worsening mental status. EXAM: CT HEAD WITHOUT CONTRAST TECHNIQUE: Contiguous axial images were obtained from the base of the skull through the vertex without intravenous contrast. COMPARISON:  CT head 05/31/15 and 06/01/2015. FINDINGS: The patient has developed increasing mass effect from an extra-axial collection on the RIGHT. The acute component, located over the posterior lateral temporal lobe on the RIGHT continues to involute, but there is increasing low to medium attenuation fluid, CONSISTENT WITH SUBACUTE SUBDURAL HEMATOMA, over the RIGHT frontal and posterior frontal convexity, and measuring up to 10 mm thickness on image 23 series 2. Right-to-left shift is also increasing at 8 mm and there is mild rotation of the brainstem and effacement of the suprasellar cistern due to uncal displacement. Evidence for chronic RIGHT frontotemporal parietal craniotomy, without acute skull fracture. No pneumocephalus. No parenchymal hemorrhage. IMPRESSION: Worsening late acute to early subacute RIGHT subdural hematoma. Increasing right-to-left shift of 8 mm. Increasing multiple prominence into the suprasellar cistern and brainstem. These results were called by telephone at the time of interpretation on 06/03/2015 at 7:00 pm to Dr. Preston Fleeting, who verbally acknowledged these results. Electronically Signed   By: Elsie Stain M.D.   On: 06/03/2015 19:06   Dg Chest Port 1 View  06/04/2015  CLINICAL DATA:  Respiratory failure, smoker EXAM: PORTABLE CHEST 1 VIEW  COMPARISON:  Portable exam 0631 hours compared to 06/03/2015 FINDINGS: Tip of endotracheal tube projects 3.3 cm above carina. Nasogastric tube coiled in proximal stomach. Normal heart size, mediastinal contours, and pulmonary vascularity. Lungs clear. No pleural effusion or pneumothorax. Bones unremarkable. IMPRESSION: No acute abnormalities. Electronically Signed   By: Ulyses Southward M.D.   On: 06/04/2015 07:57   Dg Chest Portable 1 View  06/03/2015  CLINICAL DATA:  Intubation, possible seizure, history of traumatic brain injury, smoker EXAM: PORTABLE CHEST 1 VIEW COMPARISON:  Portable exam 1714 hours compared to 03/01/2013 FINDINGS: Tip of endotracheal tube projects 3.9 cm above carina. Nasogastric tube extends into stomach. Normal heart size, mediastinal contours, and pulmonary vascularity. Minimal peribronchial thickening. Lungs clear. No pleural effusion or pneumothorax. Bones unremarkable. IMPRESSION: Tube positions as above. Minimal bronchitic changes without infiltrate. Electronically Signed   By: Loraine Leriche  Tyron Russell M.D.   On: 06/03/2015 17:32      DISCUSSION: Michael Oneill is a 40 y.o. man  ASSESSMENT / PLAN:  PULMONARY A: No primary pulmonary process, CXR with no infiltrates  Acute respiratory failure due to LOC, Intubated due to combativeness and ?post-ictal state   P:   [place] mobilise  CARDIOVASCULAR A:  Pos trop  P:  echo  RENAL A:   AKI due to Dehydration - UA with hyaline cast, elevated Uspec, and ketones Acute AG metabolic acidosis - resolved   P:   IVF NS  Avoid nephrotoxic medications Monitor I/Os   GASTROINTESTINAL A:   No acute issues  P:   Advance PO SPU: protonix SCDs  HEMATOLOGIC A:   Leukocytosis - need to r/o infectious source; possibly 2/2 ?seizure  P:  CBC in AM Hold off on anticoagulation in setting of bleed  INFECTIOUS A:   No source of infection - UA w/o signs of infection, CXR normal but PCT higth Febrile on admission Lactic acid  elevated - most likely 2/2 ?seizure  P:   Follow-up cultures Dc vanc/zoysn & observe   ENDOCRINE A:   No acute issues  P:     NEUROLOGIC A:   Acute encephalopathy- ?new-onset seizures - no h/o seizure, witnessed seizure-like activity at home, EEG neg Subdural hematoma - increased from prior study H/o TBI UDS pos opiates  P:   Neurology and neurosurgery following  Continue keppra and dilantin Restraints prn for combativeness   FAMILY  - Updates: No family at bedside 06/04/15 - Inter-disciplinary family meet or Palliative Care meeting due by:  2/21   GLOBAL  Mobilise, Transfer to floor once off precedex & remains seizure free  The patient is critically ill with multiple organ systems failure and requires high complexity decision making for assessment and support, frequent evaluation and titration of therapies, application of advanced monitoring technologies and extensive interpretation of multiple databases. Critical Care Time devoted to patient care services described in this note independent of APP time is 31 minutes.   Cyril Mourning MD. Tonny Bollman. [facility] Pager [phone] If no response call [phone]    06/05/2015 9:27 AM

## 2015-06-06 ENCOUNTER — Inpatient Hospital Stay (HOSPITAL_COMMUNITY): Payer: Medicaid Other

## 2015-06-06 DIAGNOSIS — J9600 Acute respiratory failure, unspecified whether with hypoxia or hypercapnia: Secondary | ICD-10-CM

## 2015-06-06 DIAGNOSIS — G9340 Encephalopathy, unspecified: Secondary | ICD-10-CM

## 2015-06-06 LAB — BASIC METABOLIC PANEL
Anion gap: 10 (ref 5–15)
BUN: 7 mg/dL (ref 6–20)
CO2: 23 mmol/L (ref 22–32)
Calcium: 8.9 mg/dL (ref 8.9–10.3)
Chloride: 106 mmol/L (ref 101–111)
Creatinine, Ser: 0.87 mg/dL (ref 0.61–1.24)
GFR calc Af Amer: >60 mL/min (ref >60)
GFR calc non Af Amer: >60 mL/min (ref >60)
Glucose, Bld: 151 mg/dL — ABNORMAL HIGH (ref 65–99)
Potassium: 4.2 mmol/L (ref 3.5–5.1)
Sodium: 139 mmol/L (ref 135–145)

## 2015-06-06 LAB — CBC
HCT: 34.4 % — ABNORMAL LOW (ref 39.0–52.0)
Hemoglobin: 12.2 g/dL — ABNORMAL LOW (ref 13.0–17.0)
MCH: 32.8 pg (ref 26.0–34.0)
MCHC: 35.5 g/dL (ref 30.0–36.0)
MCV: 92.5 fL (ref 78.0–100.0)
Platelets: 364 10*3/uL (ref 150–400)
RBC: 3.72 MIL/uL — ABNORMAL LOW (ref 4.22–5.81)
RDW: 12.7 % (ref 11.5–15.5)
WBC: 10.4 10*3/uL (ref 4.0–10.5)

## 2015-06-06 LAB — MAGNESIUM: Magnesium: 2.2 mg/dL (ref 1.7–2.4)

## 2015-06-06 MED ORDER — LEVETIRACETAM 500 MG PO TABS: 500.0000 mg | ORAL_TABLET | Freq: Two times a day (BID) | ORAL | Status: DC

## 2015-06-06 MED ORDER — METHYLPREDNISOLONE 4 MG PO TBPK: ORAL_TABLET | ORAL | Status: DC

## 2015-06-06 NOTE — Discharge Summary (Signed)
Physician Discharge Summary  Michael Oneill RUE:[id number] DOB: December 11, 1975 DOA: 06/03/2015  PCP: No PCP Per Patient  Admit date: 06/03/2015 Discharge date: 06/06/2015  Recommendations for Outpatient Follow-up:  1. Pt will need to follow up with PCP in 2 weeks post discharge  Discharge Diagnoses:  Acute respiratory failure with hypoxia -Secondary to loss of consciousness -Patient was intubated due to combativeness and postictal state -He was started on Precedex -Appreciate critical care medicine -Patient was extubated on 06/04/2015 -remains stable on RA  Subdural hematoma -Appreciate neurosurgical follow-up -Admission CT brain shows slight increase SDH -06/06/15 shows stable SDH -06/06/15--Dr. Jordan Likes cleared pt for discharge -Dexamethasone dosing per neurosurgery-->home with medrol taper   Seizure-like activity/seizures -Continue Keppra and Dilantin per neurology -EEG did not show seizure predisposition -CK peaked at 779  Acute encephalopathy -Concerned about new onset seizure with witnessed seizure-like activity at home -Subdural hematoma also contributing to clinical picture -Urine drug screen positive for opiates -improved  Acute kidney injury -Secondary to volume depletion -Improved with intravenous fluids  Hypokalemia -repleted -mag 2.0  Leukocytosis -Likely stressed margination secondary to seizure -Patient was febrile at the time of admission -Chest x-ray without infiltrates -Urinalysis-neg for pyuria -Blood cultures--neg to date -Patient was started on vancomycin and Zosyn due to high PCT-->discontinued on am 04/03/16 and remained stable -lactic acid 15.21-->1.2  Elevated troponin -likely demand ischemia -EKG with nonspecific ST changes -no angina  Brief Summary 40 year old with PMH significant for TBI. Status post previous right-sided craniotomy for evacuation of subdural hematoma approximately 20 years ago. Discharged from hospital 06/02/15 after  having a subdural hematoma on 05/31/15 by Dr. Jordan Likes. Dr. Jordan Likes stated the SDH was stable so patient was discharged home with outpatient follow-up.The patient had repeat CT scan which demonstrated stable appearance of the subacute subdural hematoma with mild mass effect. At that time, patient presented with a history of progressive headache with some left sided numbness and subjective weakness. He had hit his head a couple days prior to his last hospital admission without loss of consciousness. The patient represented to the emergency department on 06/03/2015 when he was found down by his mother after possible seizure-like activity. On EMS arrival was found supine, diaphoretic and unresponsive. In ED patient noted to be confused and combative. GCS scale 8 on arrival. He had elevated temperatures. Required intubation in ED for combativeness. He was admitted to the ICU with consultations with neurosurgery, critical care medicine, and neurology. The patient was continued on Keppra and Dilantin. EEG was consistent with with a generalized non-specific cerebral dysfunction(encephalopathy). Neurosurgery felt his subacute SDH, although subtly larger did not require evacuation. The patient was also started on dexamethasone. The patient was weaned off of Precedex and remained clinically stable. He was transferred to the medical floor on 06/05/2015. Repeat CT of the brain on 07/04/2015 was obtained and showed a stable subdural hematoma. Dr. Jordan Likes continued to follow the patient and cleared the patient for discharge with a steroid taper. He will continue on Keppra after discharge.  Discharge Condition: stable  Disposition: home Follow-up Information    Follow up with POOL,HENRY A, MD. Schedule an appointment as soon as possible for a visit in 1 week.   Specialty:  Neurosurgery   Why:  Need follow up head CT prior to Appt.  Call office to arrange   Contact information:   [address] [place]  [address] [phone]       Schedule an appointment as soon as possible for a visit with  [facility].   Contact information:   [address] [place] [address] [phone]      Diet:regular Wt Readings from Last 3 Encounters:  06/06/15 66.4 kg (146 lb 6.2 oz)  05/31/15 64.6 kg (142 lb 6.7 oz)  03/01/13 66.877 kg (147 lb 7 oz)      Consultants: CCM Neurology Neurosurgery--Pool  Discharge Exam: Filed Vitals:   06/05/15 2039 06/06/15 0518  BP: 118/71 112/70  Pulse: 73 70  Temp: 97.9 F (36.6 C) 99 F (37.2 C)  Resp: 18 18   Filed Vitals:   06/05/15 2000 06/05/15 2039 06/06/15 0500 06/06/15 0518  BP: 113/67 118/71  112/70  Pulse: 81 73  70  Temp:  97.9 F (36.6 C)  99 F (37.2 C)  TempSrc:  Oral  Oral  Resp: 17 18  18   Height:  5\' 4"  (1.626 m)    Weight:  66 kg (145 lb 8.1 oz) 66.4 kg (146 lb 6.2 oz)   SpO2: 100% 100%  96%   General: Awake and alert, NAD, pleasant, cooperative Cardiovascular: RRR, no rub, no gallop, no S3 Respiratory: CTAB, no wheeze, no rhonchi Abdomen:soft, nontender, nondistended, positive bowel sounds Extremities: No edema, No lymphangitis, no petechiae  Discharge Instructions     Medication List    TAKE these medications        levETIRAcetam 500 MG tablet  Commonly known as:  KEPPRA  Take 1 tablet (500 mg total) by mouth 2 (two) times daily.     methylPREDNISolone 4 MG Tbpk tablet  Commonly known as:  MEDROL DOSEPAK  follow package directions      ASK your doctor about these medications        HYDROcodone-acetaminophen 5-325 MG tablet  Commonly known as:  NORCO/VICODIN  Take 1-2 tablets by mouth every 6 (six) hours as needed for moderate pain.     ibuprofen 200 MG tablet  Commonly known as:  ADVIL,MOTRIN  Take 400 mg by mouth every 6 (six) hours as needed.         The results of significant diagnostics from this hospitalization (including imaging, microbiology,  ancillary and laboratory) are listed below for reference.    Significant Diagnostic Studies: Ct Head Wo Contrast  06/06/2015  CLINICAL DATA:  Fall, hemorrhage. EXAM: CT HEAD WITHOUT CONTRAST TECHNIQUE: Contiguous axial images were obtained from the base of the skull through the vertex without intravenous contrast. COMPARISON:  06/03/2015 FINDINGS: Again noted is the right subdural hematoma, predominantly low-density. Small acute component noted over the right posterior temporal lobe with expected further evolutionary change. Right to left midline shift of 8 mm, stable. Thickness of the subdural hematoma 9 mm compared with 10 mm previously. No hydrocephalus. No new hemorrhage or acute infarction. IMPRESSION: Essentially stable size of the right subdural hematoma and right to left midline shift. Electronically Signed   By: Charlett Nose M.D.   On: 06/06/2015 08:25   Ct Head Wo Contrast  06/03/2015  CLINICAL DATA:  Recent discharge for improving closed head injury, now with worsening mental status. EXAM: CT HEAD WITHOUT CONTRAST TECHNIQUE: Contiguous axial images were obtained from the base of the skull through the vertex without intravenous contrast. COMPARISON:  CT head 05/31/15 and 06/01/2015. FINDINGS: The patient has developed increasing mass effect from an extra-axial collection on the RIGHT. The acute component, located over the posterior lateral temporal lobe on the RIGHT continues to involute, but there is increasing low to medium attenuation fluid, CONSISTENT  WITH SUBACUTE SUBDURAL HEMATOMA, over the RIGHT frontal and posterior frontal convexity, and measuring up to 10 mm thickness on image 23 series 2. Right-to-left shift is also increasing at 8 mm and there is mild rotation of the brainstem and effacement of the suprasellar cistern due to uncal displacement. Evidence for chronic RIGHT frontotemporal parietal craniotomy, without acute skull fracture. No pneumocephalus. No parenchymal hemorrhage.  IMPRESSION: Worsening late acute to early subacute RIGHT subdural hematoma. Increasing right-to-left shift of 8 mm. Increasing multiple prominence into the suprasellar cistern and brainstem. These results were called by telephone at the time of interpretation on 06/03/2015 at 7:00 pm to Dr. Preston Fleeting, who verbally acknowledged these results. Electronically Signed   By: Elsie Stain M.D.   On: 06/03/2015 19:06   Ct Head Without Contrast  06/01/2015  CLINICAL DATA:  Follow-up subdural hematoma.  Initial encounter. EXAM: CT HEAD WITHOUT CONTRAST TECHNIQUE: Contiguous axial images were obtained from the base of the skull through the vertex without intravenous contrast. COMPARISON:  CT of the head performed 05/31/2015 FINDINGS: The patient's acute right-sided subdural hematoma is relatively stable in size, measuring 1.0 cm anteriorly and 1.2 cm along the posterior aspect of the right temporal lobe. Underlying subacute hematoma is seen. Approximately 6 mm of leftward midline shift is seen, relatively stable in appearance. The brainstem and fourth ventricle are within normal limits. The basal ganglia are unremarkable in appearance. Slight asymmetric prominence of the left lateral ventricle likely reflects mild mass effect on the right lateral ventricle, without definite evidence of hydrocephalus. There is no evidence of fracture; right-sided postoperative change is noted. The visualized portions of the orbits are within normal limits. The paranasal sinuses and mastoid air cells are well-aerated. No significant soft tissue abnormalities are seen. IMPRESSION: 1. Acute right-sided subdural hematoma is relatively stable, measuring 1.0 cm anteriorly and 1.2 cm along the posterior aspect of the right temporal lobe. Approximately 6 mm of leftward midline shift again noted. 2. Mild mass-effect on the right lateral ventricle likely explains slight asymmetric prominence of the left lateral ventricle, without definite evidence of  hydrocephalus. Electronically Signed   By: Roanna Raider M.D.   On: 06/01/2015 06:23   Ct Head Wo Contrast  05/31/2015  CLINICAL DATA:  Right-sided headache for 1 week with facial numbness and blurred vision. EXAM: CT HEAD WITHOUT CONTRAST TECHNIQUE: Contiguous axial images were obtained from the base of the skull through the vertex without intravenous contrast. COMPARISON:  Report of CT head without contrast 05/11/1999. FINDINGS: An acute right extra-axial fluid collection is present. This extends over the right convexity. Areas of hyperdense bladder most evident adjacent to the right parietal and temporal lobe extending into the right middle cranial fossa and more anteriorly over the anterior right frontal convexity. There is mass effect with effacement of the sulci and crowding of the cortex. Midline shift measures 6 mm at the foramen of Monro. No cortical infarct is present. There is no left-sided hemorrhage. There is partial effacement of the right lateral ventricle. Posterior fossa structures are intact. A remote right-sided craniotomy is again noted. The calvarium is otherwise intact. No significant extracranial soft tissue lesions are present. The globes and orbits are within normal limits. The paranasal sinuses and mastoid air cells are clear. IMPRESSION: 1. Acute/subacute right subdural hematoma with mass effect and midline shift as described. The maximum width of the hemorrhage is 8 mm. 2. 6 mm midline shift at the foramen of Monro. These results were called by telephone at the time  of interpretation on 05/31/2015 at 2:45 PM to Dr. Charlestine Night , who verbally acknowledged these results. Electronically Signed   By: Marin Roberts M.D.   On: 05/31/2015 15:03   Dg Chest Port 1 View  06/04/2015  CLINICAL DATA:  Respiratory failure, smoker EXAM: PORTABLE CHEST 1 VIEW COMPARISON:  Portable exam 0631 hours compared to 06/03/2015 FINDINGS: Tip of endotracheal tube projects 3.3 cm above carina.  Nasogastric tube coiled in proximal stomach. Normal heart size, mediastinal contours, and pulmonary vascularity. Lungs clear. No pleural effusion or pneumothorax. Bones unremarkable. IMPRESSION: No acute abnormalities. Electronically Signed   By: Ulyses Southward M.D.   On: 06/04/2015 07:57   Dg Chest Portable 1 View  06/03/2015  CLINICAL DATA:  Intubation, possible seizure, history of traumatic brain injury, smoker EXAM: PORTABLE CHEST 1 VIEW COMPARISON:  Portable exam 1714 hours compared to 03/01/2013 FINDINGS: Tip of endotracheal tube projects 3.9 cm above carina. Nasogastric tube extends into stomach. Normal heart size, mediastinal contours, and pulmonary vascularity. Minimal peribronchial thickening. Lungs clear. No pleural effusion or pneumothorax. Bones unremarkable. IMPRESSION: Tube positions as above. Minimal bronchitic changes without infiltrate. Electronically Signed   By: Ulyses Southward M.D.   On: 06/03/2015 17:32     Microbiology: Recent Results (from the past 240 hour(s))  MRSA PCR Screening     Status: None   Collection Time: 05/31/15  5:16 PM  Result Value Ref Range Status   MRSA by PCR NEGATIVE NEGATIVE Final    Comment:        The GeneXpert MRSA Assay (FDA approved for NASAL specimens only), is one component of a comprehensive MRSA colonization surveillance program. It is not intended to diagnose MRSA infection nor to guide or monitor treatment for MRSA infections.   Blood culture (routine x 2)     Status: None (Preliminary result)   Collection Time: 06/03/15  5:10 PM  Result Value Ref Range Status   Specimen Description BLOOD RIGHT HAND  Final   Special Requests BOTTLES DRAWN AEROBIC AND ANAEROBIC 5CC  Final   Culture NO GROWTH 2 DAYS  Final   Report Status PENDING  Incomplete  Urine culture     Status: None   Collection Time: 06/03/15  5:20 PM  Result Value Ref Range Status   Specimen Description URINE, CATHETERIZED  Final   Special Requests NONE  Final   Culture NO  GROWTH 2 DAYS  Final   Report Status 06/05/2015 FINAL  Final  Blood culture (routine x 2)     Status: None (Preliminary result)   Collection Time: 06/03/15  5:43 PM  Result Value Ref Range Status   Specimen Description BLOOD LEFT ANTECUBITAL  Final   Special Requests BOTTLES DRAWN AEROBIC AND ANAEROBIC 10CC  Final   Culture NO GROWTH 2 DAYS  Final   Report Status PENDING  Incomplete     Labs: Basic Metabolic Panel:  Recent Labs Lab 05/31/15 1137 05/31/15 1139 06/03/15 1710 06/04/15 0214 06/05/15 0530 06/06/15 0755  NA 136 137 136 138 139 139  K 4.5 4.2 4.7 3.3* 4.7 4.2  CL 100* 99* 100* 111 110 106  CO2 25  --  11* 18* 18* 23  GLUCOSE 99 98 174* 102* 142* 151*  BUN <5* 6 8 <5* 6 7  CREATININE 1.03 1.20 1.67* 0.95 0.90 0.87  CALCIUM 9.2  --  10.3 8.3* 8.9 8.9  MG  --   --   --  1.8 2.0 2.2  PHOS  --   --   --  1.8* 4.8*  --    Liver Function Tests:  Recent Labs Lab 05/31/15 1137 06/03/15 1710  AST 30 32  ALT 16* 10*  ALKPHOS 71 63  BILITOT 0.6 0.6  PROT 7.0 7.8  ALBUMIN 4.2 4.1   No results for input(s): LIPASE, AMYLASE in the last 168 hours. No results for input(s): AMMONIA in the last 168 hours. CBC:  Recent Labs Lab 05/31/15 1137 05/31/15 1139 06/03/15 1710 06/04/15 0214 06/06/15 0755  WBC 7.0  --  13.1* 6.1 10.4  NEUTROABS 3.5  --  5.4  --   --   HGB 13.1 15.0 14.6 10.7* 12.2*  HCT 38.1* 44.0 42.9 31.3* 34.4*  MCV 93.6  --  97.5 92.1 92.5  PLT 258  --  358 209 364   Cardiac Enzymes:  Recent Labs Lab 06/03/15 2058 06/04/15 0214 06/04/15 1000 06/05/15 0530  CKTOTAL  --   --  779* 635*  CKMB  --   --   --  3.2  TROPONINI 0.12* 0.14* 0.08*  --    BNP: Invalid input(s): POCBNP CBG:  Recent Labs Lab 06/03/15 2326  GLUCAP 100*    Time coordinating discharge:  Greater than 30 minutes  Signed:  Richerd Grime, DO [facility] Pager: [phone] 06/06/2015, 9:44 AM

## 2015-06-06 NOTE — Care Management Note (Signed)
Case Management Note  Patient Details  Name: Michael Oneill MRN: [id number] Date of Birth: 03/01/1976  Subjective/Objective:                    Action/Plan:  MATCH  Dated 06-06-15 to 06-13-15, letter given and explained to patient . [facility] [employer] given . Patient voiced understanding . Expected Discharge Date:                  Expected Discharge Plan:  Home/Self Care  In-House Referral:     Discharge planning Services  CM Consult, [facility]igent [facility], [facility] Program, Medication Assistance  Post Acute Care Choice:    Choice offered to:  Patient  DME Arranged:    DME Agency:     HH Arranged:    HH Agency:     Status of Service:  Completed, signed off  Medicare Important Message Given:    Date Medicare IM Given:    Medicare IM give by:    Date Additional Medicare IM Given:    Additional Medicare Important Message give by:     If discussed at Long Length of Stay Meetings, dates discussed:    Additional Comments:  Kingsley Plan, RN 06/06/2015, 8:41 AM

## 2015-06-06 NOTE — Progress Notes (Signed)
Patient looks much better. No headaches. No further seizures. No respiratory difficulty. Patient states he feels good and feels ready to go home.  Patient is awake and alert. He is oriented and appropriate. Cranial nerve function is intact. Speech is fluent. Judgment and insight are intact. Motor 5/5 bilaterally. No pronator drift.  Follow-up head CT scan demonstrates marked reduction in right cerebral edema. Still with a small amount of subacute posttraumatic subdural fluid but not such that I would recommend surgery.  Patient doing well. Okay to discharge home from my standpoint. He should follow up with me in one week with a follow-up head CT scan. He should remain on Keppra. I put him on a steroid taper. No need to continue Dilantin.

## 2015-06-08 LAB — CULTURE, BLOOD (ROUTINE X 2)
Culture: NO GROWTH
Culture: NO GROWTH

## 2015-06-19 ENCOUNTER — Other Ambulatory Visit: Payer: Self-pay | Admitting: Neurosurgery

## 2015-06-19 DIAGNOSIS — S065X9A Traumatic subdural hemorrhage with loss of consciousness of unspecified duration, initial encounter: Secondary | ICD-10-CM

## 2015-06-19 DIAGNOSIS — S065XAA Traumatic subdural hemorrhage with loss of consciousness status unknown, initial encounter: Secondary | ICD-10-CM

## 2015-06-23 ENCOUNTER — Ambulatory Visit
Admission: RE | Admit: 2015-06-23 | Discharge: 2015-06-23 | Disposition: A | Discharge status: Home / Self Care | Payer: Self-pay | Source: Ambulatory Visit | Attending: Neurosurgery | Admitting: Neurosurgery

## 2015-06-23 DIAGNOSIS — S065XAA Traumatic subdural hemorrhage with loss of consciousness status unknown, initial encounter: Secondary | ICD-10-CM

## 2015-06-23 DIAGNOSIS — S065X9A Traumatic subdural hemorrhage with loss of consciousness of unspecified duration, initial encounter: Secondary | ICD-10-CM

## 2015-06-30 ENCOUNTER — Ambulatory Visit: Payer: Self-pay | Attending: Family Medicine | Admitting: Family Medicine

## 2015-06-30 ENCOUNTER — Encounter: Payer: Self-pay | Admitting: Family Medicine

## 2015-06-30 VITALS — BP 115/75 | HR 55 | Temp 97.8°F | Resp 15 | Ht 68.0 in | Wt 149.2 lb

## 2015-06-30 DIAGNOSIS — S065XAA Traumatic subdural hemorrhage with loss of consciousness status unknown, initial encounter: Secondary | ICD-10-CM

## 2015-06-30 DIAGNOSIS — I6200 Nontraumatic subdural hemorrhage, unspecified: Secondary | ICD-10-CM | POA: Insufficient documentation

## 2015-06-30 DIAGNOSIS — Z79899 Other long term (current) drug therapy: Secondary | ICD-10-CM | POA: Insufficient documentation

## 2015-06-30 DIAGNOSIS — Z131 Encounter for screening for diabetes mellitus: Secondary | ICD-10-CM | POA: Insufficient documentation

## 2015-06-30 DIAGNOSIS — Z87820 Personal history of traumatic brain injury: Secondary | ICD-10-CM | POA: Insufficient documentation

## 2015-06-30 DIAGNOSIS — R569 Unspecified convulsions: Secondary | ICD-10-CM | POA: Insufficient documentation

## 2015-06-30 DIAGNOSIS — S065X9A Traumatic subdural hemorrhage with loss of consciousness of unspecified duration, initial encounter: Secondary | ICD-10-CM

## 2015-06-30 DIAGNOSIS — G629 Polyneuropathy, unspecified: Secondary | ICD-10-CM | POA: Insufficient documentation

## 2015-06-30 LAB — POCT GLYCOSYLATED HEMOGLOBIN (HGB A1C): Hemoglobin A1C: 5.4

## 2015-06-30 MED ORDER — GABAPENTIN 300 MG PO CAPS: 300.0000 mg | ORAL_CAPSULE | Freq: Every day | ORAL | Status: DC

## 2015-06-30 NOTE — Progress Notes (Signed)
Establish care Needs refill on Keppra Pain on right side of head 8/10

## 2015-06-30 NOTE — Patient Instructions (Signed)
Please return in 3 months for follow up.

## 2015-06-30 NOTE — Progress Notes (Signed)
Subjective:  Patient ID: Michael Oneill, male    DOB: [birth year]  Age: 40 y.o. MRN: [id number]  CC: Establish Care   HPI Atlee Hyland is a 40 year old male with a history of traumatic brain injury status post right-sided craniotomy for evacuation of subdural hematoma who was recently hospitalized at [facility] from 06/03/15-06/06/15 for encephalopathy secondary to subdural hematoma which was thought not to require evacuation by neurosurgery. He required intubation, was placed on dexamethasone and continued on Keppra while closely followed by neurosurgery.  He was discharged on a steroid taper and has been to see his neurosurgeon since discharge; he is requesting a refill of Keppra. He continues to have pain in the right side of his tach for which he was given hydrocodone. He complains of numbness on his left hand and left leg which is intermittent.  Outpatient Prescriptions Prior to Visit  Medication Sig Dispense Refill  . HYDROcodone-acetaminophen (NORCO/VICODIN) 5-325 MG tablet Take 1-2 tablets by mouth every 6 (six) hours as needed for moderate pain. 80 tablet 0  . levETIRAcetam (KEPPRA) 500 MG tablet Take 1 tablet (500 mg total) by mouth 2 (two) times daily. 60 tablet 6  . methylPREDNISolone (MEDROL DOSEPAK) 4 MG TBPK tablet follow package directions (Patient not taking: Reported on 06/30/2015) 21 tablet 0   No facility-administered medications prior to visit.    ROS Review of Systems  Constitutional: Negative for activity change and appetite change.  HENT: Negative for sinus pressure and sore throat.   Respiratory: Negative for chest tightness, shortness of breath and wheezing.   Cardiovascular: Negative for chest pain and palpitations.  Gastrointestinal: Negative for abdominal pain, constipation and abdominal distention.  Genitourinary: Negative.   Musculoskeletal: Negative.   Neurological: Positive for numbness and headaches.  Psychiatric/Behavioral: Negative for  behavioral problems and dysphoric mood.    Objective:  BP 115/75 mmHg  Pulse 55  Temp(Src) 97.8 F (36.6 C)  Resp 15  Ht  (1.727 m)  Wt 149 lb 3.2 oz (67.677 kg)  BMI 22.69 kg/m2  SpO2 99%  BP/Weight 06/30/2015 06/06/2015 06/02/2015  Systolic BP 115 112 167  Diastolic BP 75 70 77  Wt. (Lbs) 149.2 146.39 -  BMI 22.69 25.11 -      Physical Exam  Constitutional: He is oriented to person, place, and time. He appears well-developed and well-nourished.  HENT:  Right temporal craniotomy scar  Cardiovascular: Normal heart sounds and intact distal pulses.  Bradycardia present.   No murmur heard. Pulmonary/Chest: Effort normal and breath sounds normal. He has no wheezes. He has no rales. He exhibits no tenderness.  Abdominal: Soft. Bowel sounds are normal. He exhibits no distension and no mass. There is no tenderness.  Musculoskeletal: Normal range of motion.  Neurological: He is alert and oriented to person, place, and time.    CLINICAL DATA: Fall 05/05/2015. Occipital and right-sided headaches. Subdural hematoma. Seizures. Lower extremity weakness, worse on the left.  EXAM: CT HEAD WITHOUT CONTRAST  TECHNIQUE: Contiguous axial images were obtained from the base of the skull through the vertex without intravenous contrast.  COMPARISON: CT head without contrast 06/06/2015.  FINDINGS: The previously noted extra-axial fluid collection has resolved. No residual hemorrhage is evident. No acute infarct, hemorrhage, or other mass lesion is present. The ventricles are of normal size. There is no significant extra-axial collection on today's study.  The calvarium is otherwise intact. The paranasal sinuses and mastoid air cells are clear. No significant extracranial soft tissue lesions are present.  IMPRESSION: 1.  Interval resolution of right-sided extra-axial collection compatible with hemorrhage. 2. No new hemorrhage. 3. Right frontal  craniotomy.   Electronically Signed  By: Marin Robertshristopher Mattern M.D.  On: 06/23/2015 14:19 Assessment & Plan:   1. Diabetes mellitus screening A1c 5.4 - HgB A1c  2. Subdural hematoma [facility](HCC) Discussed ED and return precautions. Currently followed by neurosurgery-Dr. Dutch QuintPoole  3. Seizures (HCC) No recent seizures. I checked his bottle of Keppra and he does have 6 small refills  4. Neuropathy (HCC) Treated - gabapentin (NEURONTIN) 300 MG capsule; Take 1 capsule (300 mg total) by mouth at bedtime.  Dispense: 30 capsule; Refill: 2   Meds ordered this encounter  Medications  . gabapentin (NEURONTIN) 300 MG capsule    Sig: Take 1 capsule (300 mg total) by mouth at bedtime.    Dispense:  30 capsule    Refill:  2    Follow-up: Return in about 3 months (around 09/30/2015) for follow up on neuropathy.   Jaclyn ShaggyEnobong Amao MD

## 2015-07-07 MED FILL — levETIRAcetam 500 MG TABS: 500 | 30 days supply | Qty: 60 | Fill #0

## 2015-12-02 ENCOUNTER — Telehealth: Payer: Self-pay | Admitting: Family Medicine

## 2015-12-02 MED ORDER — LEVETIRACETAM 500 MG PO TABS: 500.0000 mg | ORAL_TABLET | Freq: Two times a day (BID) | ORAL | 0 refills | Status: DC

## 2015-12-02 NOTE — Telephone Encounter (Signed)
Pt. Called requesting a refill levETIRAcetam (KEPPRA) 500 MG tablet. [facility]. [facility]. Please f/u

## 2015-12-02 NOTE — Telephone Encounter (Signed)
Medication refilled - patient needs office visits for further refills.

## 2016-10-26 ENCOUNTER — Encounter (HOSPITAL_COMMUNITY): Payer: Self-pay | Admitting: Emergency Medicine

## 2016-10-26 DIAGNOSIS — H538 Other visual disturbances: Secondary | ICD-10-CM | POA: Insufficient documentation

## 2016-10-26 DIAGNOSIS — M549 Dorsalgia, unspecified: Secondary | ICD-10-CM | POA: Insufficient documentation

## 2016-10-26 DIAGNOSIS — R51 Headache: Secondary | ICD-10-CM | POA: Insufficient documentation

## 2016-10-26 NOTE — ED Triage Notes (Signed)
Pt c/o back pain and headache. States he had a head injury in the 90s, headache began 2 weeks ago, back pain began today when he lifted something. Pt denies recent head injury, c/o occasional blurred vision, not bothered by light and sound.

## 2016-10-27 ENCOUNTER — Emergency Department (HOSPITAL_COMMUNITY)
Admission: EM | Admit: 2016-10-27 | Discharge: 2016-10-27 | Disposition: A | Discharge status: Home / Self Care | Payer: Medicaid Other | Attending: Emergency Medicine | Admitting: Emergency Medicine

## 2016-10-27 NOTE — ED Notes (Signed)
Called for 2x to a room. No answer.

## 2016-10-27 NOTE — ED Notes (Signed)
Call 3rd time. Patient didn't answer.

## 2016-11-01 ENCOUNTER — Encounter (HOSPITAL_COMMUNITY): Payer: Self-pay

## 2016-11-01 ENCOUNTER — Emergency Department (HOSPITAL_COMMUNITY)
Admission: EM | Admit: 2016-11-01 | Discharge: 2016-11-01 | Disposition: A | Discharge status: Home / Self Care | Payer: Medicaid Other | Attending: Emergency Medicine | Admitting: Emergency Medicine

## 2016-11-01 DIAGNOSIS — R51 Headache: Secondary | ICD-10-CM | POA: Insufficient documentation

## 2016-11-01 DIAGNOSIS — R569 Unspecified convulsions: Secondary | ICD-10-CM | POA: Insufficient documentation

## 2016-11-01 DIAGNOSIS — G8929 Other chronic pain: Secondary | ICD-10-CM

## 2016-11-01 DIAGNOSIS — G629 Polyneuropathy, unspecified: Secondary | ICD-10-CM

## 2016-11-01 DIAGNOSIS — Z87820 Personal history of traumatic brain injury: Secondary | ICD-10-CM | POA: Insufficient documentation

## 2016-11-01 DIAGNOSIS — F17210 Nicotine dependence, cigarettes, uncomplicated: Secondary | ICD-10-CM | POA: Insufficient documentation

## 2016-11-01 LAB — CBC
HCT: 37.2 % — ABNORMAL LOW (ref 39.0–52.0)
Hemoglobin: 13.3 g/dL (ref 13.0–17.0)
MCH: 33.2 pg (ref 26.0–34.0)
MCHC: 35.8 g/dL (ref 30.0–36.0)
MCV: 92.8 fL (ref 78.0–100.0)
Platelets: 183 10*3/uL (ref 150–400)
RBC: 4.01 MIL/uL — ABNORMAL LOW (ref 4.22–5.81)
RDW: 13.8 % (ref 11.5–15.5)
WBC: 5.5 10*3/uL (ref 4.0–10.5)

## 2016-11-01 LAB — COMPREHENSIVE METABOLIC PANEL
ALT: 27 U/L (ref 17–63)
AST: 52 U/L — ABNORMAL HIGH (ref 15–41)
Albumin: 4.2 g/dL (ref 3.5–5.0)
Alkaline Phosphatase: 76 U/L (ref 38–126)
Anion gap: 11 (ref 5–15)
BUN: 10 mg/dL (ref 6–20)
CO2: 24 mmol/L (ref 22–32)
Calcium: 8.9 mg/dL (ref 8.9–10.3)
Chloride: 108 mmol/L (ref 101–111)
Creatinine, Ser: 1.12 mg/dL (ref 0.61–1.24)
GFR calc Af Amer: >60 mL/min (ref >60)
GFR calc non Af Amer: >60 mL/min (ref >60)
Glucose, Bld: 91 mg/dL (ref 65–99)
Potassium: 3.9 mmol/L (ref 3.5–5.1)
Sodium: 143 mmol/L (ref 135–145)
Total Bilirubin: 0.2 mg/dL — ABNORMAL LOW (ref 0.3–1.2)
Total Protein: 7.2 g/dL (ref 6.5–8.1)

## 2016-11-01 LAB — CBG MONITORING, ED: Glucose-Capillary: 87 mg/dL (ref 65–99)

## 2016-11-01 MED ORDER — GABAPENTIN 300 MG PO CAPS: 300.0000 mg | ORAL_CAPSULE | Freq: Every day | ORAL | 0 refills | Status: DC

## 2016-11-01 MED ORDER — LEVETIRACETAM 500 MG PO TABS: 500.0000 mg | ORAL_TABLET | Freq: Two times a day (BID) | ORAL | 0 refills | Status: DC

## 2016-11-01 MED ORDER — GABAPENTIN 300 MG PO CAPS
300.0000 mg | ORAL_CAPSULE | Freq: Once | ORAL | Status: AC
  Administered 2016-11-01: 300 mg via ORAL
  Filled 2016-11-01: qty 1

## 2016-11-01 MED ORDER — IBUPROFEN 200 MG PO TABS
600.0000 mg | ORAL_TABLET | Freq: Once | ORAL | Status: AC
  Administered 2016-11-01: 600 mg via ORAL
  Filled 2016-11-01: qty 3

## 2016-11-01 MED ORDER — LEVETIRACETAM 500 MG PO TABS
500.0000 mg | ORAL_TABLET | Freq: Once | ORAL | Status: AC
  Administered 2016-11-01: 500 mg via ORAL
  Filled 2016-11-01: qty 1

## 2016-11-01 NOTE — ED Triage Notes (Signed)
Pt c/o headache and numbness since he ran out of his seizure medication last Thursday. Pt has a hx of a TBI in the 90s. He also states that he feels uncoordinated and dizzy following a fall yesterday. He is unsure if he hit his head yesterday when he fell. States he is sensitive to light and is experiencing intermittent blurred vision. A&Ox4.

## 2016-11-01 NOTE — ED Provider Notes (Signed)
[facility]-EMERGENCY DEPT Provider Note   CSN: [id number] Arrival date & time: 11/01/16  1758     History   Chief Complaint Chief Complaint  Patient presents with  . Altered Mental Status    HPI Michael Oneill is a [age] y.o. male.  HPI  41 year old male with a past history of a subarachnoid hemorrhage and subdural hematoma presents with a seizure yesterday. He states that he was at his nephew's house and his nephew states that he all of a sudden had diffuse shaking. All the patient remembers is passing out. He is supposed to be on Keppra and Neurontin but he has not taken medicine 5 days because he ran out of his medicine. His neurosurgeon is Dr. Jordan LikesPool who he follows up with for all of his seizures. He has chronic daily headaches ever since this started back in February 2017. Patient does not return med these are but knows he is on 2 different medicines. He typically takes ibuprofen for this daily headache. Otherwise nothing is different including the headache is of saying quality and same type as it usually is and he has not had any fevers, neck stiffness, or new weakness or numbness. He has chronic intermittent left-sided weakness and numbness as well as chronic difficulty walking. However neither of these are new.  Past Medical History:  Diagnosis Date  . TBI (traumatic brain injury) [facility](HCC) 1997    Patient Active Problem List   Diagnosis Date Noted  . Neuropathy 06/30/2015  . Acute encephalopathy 06/06/2015  . Acute respiratory failure with hypoxia (HCC) 06/04/2015  . Seizures (HCC) 06/03/2015  . Subdural hematoma (HCC) 05/31/2015  . INGUINAL HERNIA 02/06/2007  . HERNIORRHAPHY, HX OF 02/06/2007    Past Surgical History:  Procedure Laterality Date  . BRAIN SURGERY         Home Medications    Prior to Admission medications   Medication Sig Start Date End Date Taking? Authorizing Provider  HYDROcodone-acetaminophen (NORCO/VICODIN) 5-325 MG tablet Take 1-2 tablets by mouth  every 6 (six) hours as needed for moderate pain. 06/02/15  Yes Pool, Sherilyn CooterHenry, MD  gabapentin (NEURONTIN) 300 MG capsule Take 1 capsule (300 mg total) by mouth at bedtime. 11/01/16   Pricilla LovelessGoldston, Rechelle Niebla, MD  levETIRAcetam (KEPPRA) 500 MG tablet Take 1 tablet (500 mg total) by mouth 2 (two) times daily. 11/01/16   Pricilla LovelessGoldston, Linc Renne, MD    Family History History reviewed. No pertinent family history.  Social History Social History  Substance Use Topics  . Smoking status: Current Every Day Smoker    Packs/day: 0.25  . Smokeless tobacco: Never Used  . Alcohol use No     Allergies   Patient has no known allergies.   Review of Systems Review of Systems  Constitutional: Negative for fever.  Gastrointestinal: Negative for vomiting.  Musculoskeletal: Positive for gait problem.  Neurological: Positive for dizziness, seizures, weakness, numbness and headaches.  All other systems reviewed and are negative.    Physical Exam Updated Vital Signs BP 120/72   Pulse 72   Temp 98.2 F (36.8 C) (Oral)   Resp 18   Ht 5\' 6"  (1.676 m)   Wt 65.8 kg (145 lb)   SpO2 98%   BMI 23.40 kg/m   Physical Exam  Constitutional: He is oriented to person, place, and time. He appears well-developed and well-nourished.  HENT:  Head: Normocephalic and atraumatic.  Right Ear: External ear normal.  Left Ear: External ear normal.  Nose: Nose normal.  Eyes: Pupils are equal, round,  and reactive to light. EOM are normal. Right eye exhibits no discharge. Left eye exhibits no discharge.  Neck: Normal range of motion. Neck supple.  Cardiovascular: Normal rate, regular rhythm and normal heart sounds.   Pulmonary/Chest: Effort normal and breath sounds normal.  Abdominal: Soft. There is no tenderness.  Musculoskeletal: He exhibits no edema.  Neurological: He is alert and oriented to person, place, and time.  CN 3-12 grossly intact. 5/5 strength in all 4 extremities. Grossly normal sensation. Normal finger to nose.     Skin: Skin is warm and dry.  Nursing note and vitals reviewed.    ED Treatments / Results  Labs (all labs ordered are listed, but only abnormal results are displayed) Labs Reviewed  COMPREHENSIVE METABOLIC PANEL - Abnormal; Notable for the following:       Result Value   AST 52 (*)    Total Bilirubin 0.2 (*)    All other components within normal limits  CBC - Abnormal; Notable for the following:    RBC 4.01 (*)    HCT 37.2 (*)    All other components within normal limits  CBG MONITORING, ED    EKG  EKG Interpretation None       Radiology No results found.  Procedures Procedures (including critical care time)  Medications Ordered in ED Medications  levETIRAcetam (KEPPRA) tablet 500 mg (500 mg Oral Given 11/01/16 2011)  gabapentin (NEURONTIN) capsule 300 mg (300 mg Oral Given 11/01/16 2011)  ibuprofen (ADVIL,MOTRIN) tablet 600 mg (600 mg Oral Given 11/01/16 2011)     Initial Impression / Assessment and Plan / ED Course  I have reviewed the triage vital signs and the nursing notes.  Pertinent labs & imaging results that were available during my care of the patient were reviewed by me and considered in my medical decision making (see chart for details).     Patient has not had any seizure-like activity here. He has a benign neuro exam at this time. He has chronic, daily headaches that are not different than typical and I do not think emergent imaging would be needed in an otherwise uncomplicated seizure presentation. It seemed like his seizures due to missing his Keppra and Neurontin. He will be given these here as well as a prescription. Have encouraged him to use a follow closely with his neurosurgeon, Dr. Jordan Likes. Discussed return precautions.  Final Clinical Impressions(s) / ED Diagnoses   Final diagnoses:  Seizure (HCC)  Chronic nonintractable headache, unspecified headache type    New Prescriptions Discharge Medication List as of 11/01/2016  8:13 PM        Pricilla Loveless, MD 11/01/16 2034

## 2016-11-02 MED FILL — ?LEVETIRACETAM 500 MG TABLE: 500 | 30 days supply | Qty: 60 | Fill #0

## 2016-11-02 MED FILL — GABAPENTIN 300 MG CAPSULE: 300 | 30 days supply | Qty: 30 | Fill #0

## 2016-11-16 ENCOUNTER — Emergency Department (HOSPITAL_COMMUNITY)
Admission: EM | Admit: 2016-11-16 | Discharge: 2016-11-16 | Disposition: A | Discharge status: Home / Self Care | Payer: Self-pay | Attending: Emergency Medicine | Admitting: Emergency Medicine

## 2016-11-16 ENCOUNTER — Encounter (HOSPITAL_COMMUNITY): Payer: Self-pay

## 2016-11-16 ENCOUNTER — Emergency Department (HOSPITAL_COMMUNITY): Payer: Self-pay

## 2016-11-16 DIAGNOSIS — R51 Headache: Secondary | ICD-10-CM | POA: Insufficient documentation

## 2016-11-16 DIAGNOSIS — R42 Dizziness and giddiness: Secondary | ICD-10-CM | POA: Insufficient documentation

## 2016-11-16 DIAGNOSIS — R519 Headache, unspecified: Secondary | ICD-10-CM

## 2016-11-16 DIAGNOSIS — F17200 Nicotine dependence, unspecified, uncomplicated: Secondary | ICD-10-CM | POA: Insufficient documentation

## 2016-11-16 HISTORY — DX: Unspecified convulsions: R56.9

## 2016-11-16 LAB — I-STAT CHEM 8, ED
BUN: 11 mg/dL (ref 6–20)
Calcium, Ion: 1.04 mmol/L — ABNORMAL LOW (ref 1.15–1.40)
Chloride: 105 mmol/L (ref 101–111)
Creatinine, Ser: 1.2 mg/dL (ref 0.61–1.24)
Glucose, Bld: 88 mg/dL (ref 65–99)
HCT: 52 % (ref 39.0–52.0)
Hemoglobin: 17.7 g/dL — ABNORMAL HIGH (ref 13.0–17.0)
Potassium: 4.3 mmol/L (ref 3.5–5.1)
Sodium: 139 mmol/L (ref 135–145)
TCO2: 22 mmol/L (ref 0–100)

## 2016-11-16 MED ORDER — DIPHENHYDRAMINE HCL 50 MG/ML IJ SOLN
25.0000 mg | Freq: Once | INTRAMUSCULAR | Status: AC
  Administered 2016-11-16: 25 mg via INTRAVENOUS
  Filled 2016-11-16: qty 1

## 2016-11-16 MED ORDER — SODIUM CHLORIDE 0.9 % IV BOLUS (SEPSIS)
1000.0000 mL | Freq: Once | INTRAVENOUS | Status: AC
  Administered 2016-11-16: 1000 mL via INTRAVENOUS

## 2016-11-16 MED ORDER — PROCHLORPERAZINE EDISYLATE 5 MG/ML IJ SOLN
10.0000 mg | Freq: Once | INTRAMUSCULAR | Status: AC
  Administered 2016-11-16: 10 mg via INTRAVENOUS
  Filled 2016-11-16: qty 2

## 2016-11-16 MED ORDER — DEXAMETHASONE SODIUM PHOSPHATE 10 MG/ML IJ SOLN
10.0000 mg | Freq: Once | INTRAMUSCULAR | Status: AC
  Administered 2016-11-16: 10 mg via INTRAVENOUS
  Filled 2016-11-16: qty 1

## 2016-11-16 NOTE — ED Triage Notes (Signed)
Patient c/o headache x 1 week. Patient states he was seen in the ED lst week, but the headache never went away. Patient also reports that he woke this AM with a rash on his arms and legs. Patient states slight sensitivity to light. Patient also c/o dizziness if he stands up too long.

## 2016-11-16 NOTE — ED Notes (Signed)
Patient transported to CT 

## 2016-11-16 NOTE — ED Provider Notes (Signed)
[facility] DEPT Provider Note   CSN: [id number] Arrival date & time: 11/16/16  0849     History   Chief Complaint Chief Complaint  Patient presents with  . Headache  . Rash  . Dizziness    HPI Michael Oneill is a 41 y.o. male.  HPI  Had a seizure 2 weeks ago, since then has had headache, frontal, sharp/dull, began slowly and has progressed.   Is similar to headache he has had daily since traumatic head injury.  Reports it is worse with bright lights and loud sounds. No nausea or vomiting.  Has not missed any medicine.   Reports lightheadedness when he stands, no other times. No numbness/weakness.  Past Medical History:  Diagnosis Date  . Seizures (HCC)   . TBI (traumatic brain injury) [facility](HCC) 1997    Patient Active Problem List   Diagnosis Date Noted  . Neuropathy 06/30/2015  . Acute encephalopathy 06/06/2015  . Acute respiratory failure with hypoxia (HCC) 06/04/2015  . Seizures (HCC) 06/03/2015  . Subdural hematoma (HCC) 05/31/2015  . INGUINAL HERNIA 02/06/2007  . HERNIORRHAPHY, HX OF 02/06/2007    Past Surgical History:  Procedure Laterality Date  . BRAIN SURGERY         Home Medications    Prior to Admission medications   Medication Sig Start Date End Date Taking? Authorizing Provider  gabapentin (NEURONTIN) 300 MG capsule Take 1 capsule (300 mg total) by mouth at bedtime. 11/01/16  Yes Pricilla LovelessGoldston, Scott, MD  levETIRAcetam (KEPPRA) 500 MG tablet Take 1 tablet (500 mg total) by mouth 2 (two) times daily. 11/01/16  Yes Pricilla LovelessGoldston, Scott, MD    Family History Family History  Problem Relation Age of Onset  . Heart failure Mother     Social History Social History  Substance Use Topics  . Smoking status: Current Every Day Smoker    Packs/day: 0.25  . Smokeless tobacco: Never Used  . Alcohol use No     Allergies   Patient has no known allergies.   Review of Systems Review of Systems  Constitutional: Negative for fever.  HENT: Negative for  sore throat.   Eyes: Negative for visual disturbance.  Respiratory: Negative for shortness of breath.   Cardiovascular: Negative for chest pain.  Gastrointestinal: Negative for abdominal pain.  Genitourinary: Negative for difficulty urinating.  Musculoskeletal: Negative for back pain and neck stiffness.  Skin: Negative for rash.  Neurological: Positive for light-headedness and headaches. Negative for syncope.     Physical Exam Updated Vital Signs BP 126/86   Pulse 62   Temp 97.8 F (36.6 C)   Resp 16   Ht 5\' 6"  (1.676 m)   Wt 65.8 kg (145 lb)   SpO2 100%   BMI 23.40 kg/m   Physical Exam  Constitutional: He is oriented to person, place, and time. He appears well-developed and well-nourished. No distress.  HENT:  Head: Normocephalic and atraumatic.  Right craniotomy scar  Eyes: Conjunctivae and EOM are normal.  Neck: Normal range of motion.  Cardiovascular: Normal rate, regular rhythm, normal heart sounds and intact distal pulses.  Exam reveals no gallop and no friction rub.   No murmur heard. Pulmonary/Chest: Effort normal and breath sounds normal. No respiratory distress. He has no wheezes. He has no rales.  Abdominal: Soft. He exhibits no distension. There is no tenderness. There is no guarding.  Musculoskeletal: He exhibits no edema.  Neurological: He is alert and oriented to person, place, and time. He has normal strength. No cranial nerve  deficit or sensory deficit. GCS eye subscore is 4. GCS verbal subscore is 5. GCS motor subscore is 6.  Skin: Skin is warm and dry. He is not diaphoretic.  Nursing note and vitals reviewed.    ED Treatments / Results  Labs (all labs ordered are listed, but only abnormal results are displayed) Labs Reviewed  I-STAT CHEM 8, ED - Abnormal; Notable for the following:       Result Value   Calcium, Ion 1.04 (*)    Hemoglobin 17.7 (*)    All other components within normal limits    EKG  EKG Interpretation None        Radiology Ct Head Wo Contrast  Result Date: 11/16/2016 CLINICAL DATA:  Headache for 1 week.  History of subdural hematoma. EXAM: CT HEAD WITHOUT CONTRAST TECHNIQUE: Contiguous axial images were obtained from the base of the skull through the vertex without intravenous contrast. COMPARISON:  Head CT 06/23/2015 FINDINGS: Brain: No mass lesion, intraparenchymal hemorrhage or extra-axial collection. No evidence of acute cortical infarct. Volume loss is mildly advanced for age. No focal parenchymal abnormality. Vascular: No hyperdense vessel or unexpected calcification. Skull: Remote right pterional craniotomy. Sinuses/Orbits: No sinus fluid levels or advanced mucosal thickening. No mastoid effusion. Normal orbits. IMPRESSION: 1. No acute intracranial abnormality. 2. Mildly age advanced atrophy, unchanged. Electronically Signed   By: Deatra RobinsonKevin  Herman M.D.   On: 11/16/2016 14:03    Procedures Procedures (including critical care time)  Medications Ordered in ED Medications  sodium chloride 0.9 % bolus 1,000 mL (0 mLs Intravenous Stopped 11/16/16 1528)  dexamethasone (DECADRON) injection 10 mg (10 mg Intravenous Given 11/16/16 1419)  prochlorperazine (COMPAZINE) injection 10 mg (10 mg Intravenous Given 11/16/16 1420)  diphenhydrAMINE (BENADRYL) injection 25 mg (25 mg Intravenous Given 11/16/16 1418)     Initial Impression / Assessment and Plan / ED Course  I have reviewed the triage vital signs and the nursing notes.  Pertinent labs & imaging results that were available during my care of the patient were reviewed by me and considered in my medical decision making (see chart for details).     41yo male with history of TBI, subdural/subarachnoid hemorrhage, seizures, presents with concern for headache. Patient with worsening symptoms over the last 2 weeks, and given hx and worsening after presentation to ED recently will obtain CT head.  CT head wihtout acute abnormalities. Regarding lightheadedness,  orthostatic by hx. Doubt PE/ACS/arrhythmia. Labs show no significant anemia or electrolyte abnormality.  Recommend hydration. Patient improved following headache cocktail. Patient discharged in stable condition with understanding of reasons to return.   Final Clinical Impressions(s) / ED Diagnoses   Final diagnoses:  Nonintractable headache, unspecified chronicity pattern, unspecified headache type  Lightheadedness    New Prescriptions Discharge Medication List as of 11/16/2016  3:13 PM       Alvira MondaySchlossman, Steven Basso, MD 11/16/16 2358

## 2017-04-17 ENCOUNTER — Emergency Department (HOSPITAL_COMMUNITY)
Admission: EM | Admit: 2017-04-17 | Discharge: 2017-04-17 | Disposition: A | Discharge status: Home / Self Care | Payer: Self-pay | Attending: Emergency Medicine | Admitting: Emergency Medicine

## 2017-04-17 ENCOUNTER — Encounter (HOSPITAL_COMMUNITY): Payer: Self-pay

## 2017-04-17 DIAGNOSIS — G629 Polyneuropathy, unspecified: Secondary | ICD-10-CM

## 2017-04-17 DIAGNOSIS — Z9114 Patient's other noncompliance with medication regimen: Secondary | ICD-10-CM | POA: Insufficient documentation

## 2017-04-17 DIAGNOSIS — Z79899 Other long term (current) drug therapy: Secondary | ICD-10-CM | POA: Insufficient documentation

## 2017-04-17 DIAGNOSIS — F17210 Nicotine dependence, cigarettes, uncomplicated: Secondary | ICD-10-CM | POA: Insufficient documentation

## 2017-04-17 DIAGNOSIS — R569 Unspecified convulsions: Secondary | ICD-10-CM | POA: Insufficient documentation

## 2017-04-17 LAB — CBG MONITORING, ED: Glucose-Capillary: 100 mg/dL — ABNORMAL HIGH (ref 65–99)

## 2017-04-17 MED ORDER — GABAPENTIN 300 MG PO CAPS: 300.0000 mg | ORAL_CAPSULE | Freq: Every day | ORAL | 3 refills | Status: DC

## 2017-04-17 MED ORDER — SODIUM CHLORIDE 0.9 % IV SOLN
1000.0000 mg | Freq: Once | INTRAVENOUS | Status: AC
  Administered 2017-04-17: 1000 mg via INTRAVENOUS
  Filled 2017-04-17: qty 10

## 2017-04-17 MED ORDER — LEVETIRACETAM 500 MG PO TABS: 500.0000 mg | ORAL_TABLET | Freq: Two times a day (BID) | ORAL | 3 refills | Status: DC

## 2017-04-17 NOTE — ED Notes (Signed)
ED Provider at bedside. 

## 2017-04-17 NOTE — ED Provider Notes (Signed)
[facility]-EMERGENCY DEPT Provider Note   CSN: [id number] Arrival date & time: 04/17/17  0003     History   Chief Complaint Chief Complaint  Patient presents with  . Seizures    HPI Michael Oneill is a 41 y.o. male.  HPI Patient is a 41 year old male who presents to the emergency department after reported seizure today.  He does admit to drinking alcohol today.  He states he is currently been off of his Keppra for the last 2 months secondary to financial issues.  He has a history of subdural hematoma status post craniotomy and evacuation in the spring 2018.  He denies headache at this time.  He has not followed up with a neurologist per the patient.  He denies weakness of his arms or legs.  He was states he was otherwise in his normal state of health.  Denies tongue biting.  Denies urinary incontinence.   Past Medical History:  Diagnosis Date  . Seizures (HCC)   . TBI (traumatic brain injury) [facility](HCC) 1997    Patient Active Problem List   Diagnosis Date Noted  . Neuropathy 06/30/2015  . Acute encephalopathy 06/06/2015  . Acute respiratory failure with hypoxia (HCC) 06/04/2015  . Seizures (HCC) 06/03/2015  . Subdural hematoma (HCC) 05/31/2015  . INGUINAL HERNIA 02/06/2007  . HERNIORRHAPHY, HX OF 02/06/2007    Past Surgical History:  Procedure Laterality Date  . BRAIN SURGERY         Home Medications    Prior to Admission medications   Medication Sig Start Date End Date Taking? Authorizing Provider  gabapentin (NEURONTIN) 300 MG capsule Take 1 capsule (300 mg total) by mouth at bedtime. 04/17/17   Azalia Bilisampos, Jennylee Uehara, MD  levETIRAcetam (KEPPRA) 500 MG tablet Take 1 tablet (500 mg total) by mouth 2 (two) times daily. 04/17/17   Azalia Bilisampos, Astin Sayre, MD    Family History Family History  Problem Relation Age of Onset  . Heart failure Mother     Social History Social History   Tobacco Use  . Smoking status: Current Every Day Smoker    Packs/day:  0.25  . Smokeless tobacco: Never Used  Substance Use Topics  . Alcohol use: No  . Drug use: No     Allergies   Patient has no known allergies.   Review of Systems Review of Systems  All other systems reviewed and are negative.    Physical Exam Updated Vital Signs BP 103/77   Pulse 72   Temp 97.6 F (36.4 C) (Oral)   Resp (!) 21   Ht 5\' 8"  (1.727 m)   Wt 65.8 kg (145 lb)   SpO2 96%   BMI 22.05 kg/m   Physical Exam  Constitutional: He is oriented to person, place, and time. He appears well-developed and well-nourished.  HENT:  Head: Normocephalic and atraumatic.  Eyes: EOM are normal. Pupils are equal, round, and reactive to light.  Neck: Normal range of motion.  Cardiovascular: Normal rate, regular rhythm, normal heart sounds and intact distal pulses.  Pulmonary/Chest: Effort normal and breath sounds normal. No respiratory distress.  Abdominal: Soft. He exhibits no distension. There is no tenderness.  Musculoskeletal: Normal range of motion.  Neurological: He is alert and oriented to person, place, and time.  5/5 strength in major muscle groups of  bilateral upper and lower extremities. Speech normal. No facial asymetry.   Skin: Skin is warm and dry.  Psychiatric: He has a normal mood and affect. Judgment normal.  Nursing note  and vitals reviewed.    ED Treatments / Results  Labs (all labs ordered are listed, but only abnormal results are displayed) Labs Reviewed  CBG MONITORING, ED - Abnormal; Notable for the following components:      Result Value   Glucose-Capillary 100 (*)    All other components within normal limits    EKG  EKG Interpretation None       Radiology No results found.  Procedures Procedures (including critical care time)  Medications Ordered in ED Medications  levETIRAcetam (KEPPRA) 1,000 mg in sodium chloride 0.9 % 100 mL IVPB (0 mg Intravenous Stopped 04/17/17 0130)     Initial Impression / Assessment and Plan / ED  Course  I have reviewed the triage vital signs and the nursing notes.  Pertinent labs & imaging results that were available during my care of the patient were reviewed by me and considered in my medical decision making (see chart for details).     Patient is overall well-appearing.  Baseline mental status.  Loaded with IV Keppra.  Patient be discharged home on Keppra.  Instructed to follow-up with her primary care physician and a neurologist.  He understands return to the ER for new or worsening symptoms   Final Clinical Impressions(s) / ED Diagnoses   Final diagnoses:  Seizure (HCC)  H/O medication noncompliance    ED Discharge Orders        Ordered    gabapentin (NEURONTIN) 300 MG capsule  Daily at bedtime     04/17/17 0242    levETIRAcetam (KEPPRA) 500 MG tablet  2 times daily     04/17/17 0242       Azalia Bilisampos, Gabryella Murfin, MD 04/17/17 [phone]

## 2017-04-17 NOTE — Discharge Instructions (Signed)
Please the neurologist (seizure expert) for follow up  Please develop a relationship with a primary care provider

## 2017-04-17 NOTE — ED Triage Notes (Signed)
Patient arrives by EMS with complaint of seizure. EMS states patient was at a bonfire at family's residence-patient leaned over to pick up a log and started mumbling to himself and fell over with seizure. Smells of ETOH. Patient off meds for 2 months due to Medicaid issues. Family states patient has frequent seizures. Patient has abrasion to left forehead and bridge of nose. Denies any neck or back pain. Answering questions appropriately but slurred speech.

## 2017-04-17 NOTE — ED Notes (Signed)
While helping patient into a gown, patient states to writer "Damn you sexy". Writer told patient that was inappropriate and patient began mumbiling.

## 2017-04-17 NOTE — ED Notes (Signed)
Bed: [facility]20 Expected date:  Expected time:  Means of arrival:  Comments: EMS 41 yo male from home/fall-standing around a bonfire-possible seizure-hx seizures-off meds x 2 months

## 2017-05-18 ENCOUNTER — Ambulatory Visit (HOSPITAL_COMMUNITY)
Admission: EM | Admit: 2017-05-18 | Discharge: 2017-05-18 | Disposition: A | Discharge status: Home / Self Care | Payer: Self-pay | Attending: Family Medicine | Admitting: Family Medicine

## 2017-05-18 ENCOUNTER — Encounter (HOSPITAL_COMMUNITY): Payer: Self-pay | Admitting: Emergency Medicine

## 2017-05-18 ENCOUNTER — Ambulatory Visit: Payer: Self-pay | Admitting: Nurse Practitioner

## 2017-05-18 DIAGNOSIS — Z760 Encounter for issue of repeat prescription: Secondary | ICD-10-CM

## 2017-05-18 DIAGNOSIS — G629 Polyneuropathy, unspecified: Secondary | ICD-10-CM

## 2017-05-18 DIAGNOSIS — R569 Unspecified convulsions: Secondary | ICD-10-CM

## 2017-05-18 DIAGNOSIS — G40909 Epilepsy, unspecified, not intractable, without status epilepticus: Secondary | ICD-10-CM

## 2017-05-18 MED ORDER — LEVETIRACETAM 500 MG PO TABS: 500.0000 mg | ORAL_TABLET | Freq: Two times a day (BID) | ORAL | 0 refills | Status: DC

## 2017-05-18 MED ORDER — GABAPENTIN 300 MG PO CAPS: 300.0000 mg | ORAL_CAPSULE | Freq: Every day | ORAL | 0 refills | Status: DC

## 2017-05-18 MED FILL — levETIRAcetam 500 MG TABS: 500 | 30 days supply | Qty: 60 | Fill #0

## 2017-05-18 MED FILL — GABAPENTIN 300 MG CAPSULE: 300 | 30 days supply | Qty: 30 | Fill #0

## 2017-05-18 NOTE — ED Triage Notes (Signed)
PT C/O: needing refill on seizure meds  Reports [facility] but PCP had to leave on an emergency and was told to come here.   Sx to day include HA  A&O x4... NAD... Ambulatory

## 2017-05-23 NOTE — ED Provider Notes (Signed)
  [facility]   [id number] 05/18/17 Arrival Time: 1528  ASSESSMENT & PLAN:  1. Medication refill   2. Seizures (HCC)   3. Neuropathy     Meds ordered this encounter  Medications  . gabapentin (NEURONTIN) 300 MG capsule    Sig: Take 1 capsule (300 mg total) by mouth at bedtime.    Dispense:  30 capsule    Refill:  0  . levETIRAcetam (KEPPRA) 500 MG tablet    Sig: Take 1 tablet (500 mg total) by mouth 2 (two) times daily.    Dispense:  60 tablet    Refill:  0   Keep f/u appt.  Reviewed expectations re: course of current medical issues. Questions answered. Outlined signs and symptoms indicating need for more acute intervention. Patient verbalized understanding. After Visit Summary given.   SUBJECTIVE: History from: patient. Gwen PoundsDaran Jeansonne is a 42 y.o. male who presents requesting a medication refill. Unable to [facility] today. Has rescheduled but will run out of meds before he can go. Stable medical problems. No concerns.  ROS: As per HPI.   OBJECTIVE:  Vitals:   05/18/17 1548  BP: 127/79  Pulse: 65  Resp: 20  Temp: 98.4 F (36.9 C)  TempSrc: Oral  SpO2: 100%    General appearance: alert; no distress Lungs: clear to auscultation bilaterally Heart: regular rate and rhythm Extremities: no edema Skin: warm and dry Neurologic: normal gait; normal symmetric reflexes Psychological: alert and cooperative; normal mood and affect   No Known Allergies  Past Medical History:  Diagnosis Date  . Seizures (HCC)   . TBI (traumatic brain injury) (HCC) 1997   Social History   Socioeconomic History  . Marital status: Single    Spouse name: Not on file  . Number of children: Not on file  . Years of education: Not on file  . Highest education level: Not on file  Social Needs  . Financial resource strain: Not on file  . Food insecurity - worry: Not on file  . Food insecurity - inability: Not on file  . Transportation needs - medical:  Not on file  . Transportation needs - non-medical: Not on file  Occupational History  . Not on file  Tobacco Use  . Smoking status: Current Every Day Smoker    Packs/day: 0.25  . Smokeless tobacco: Never Used  Substance and Sexual Activity  . Alcohol use: No  . Drug use: No  . Sexual activity: Not on file  Other Topics Concern  . Not on file  Social History Narrative  . Not on file   Family History  Problem Relation Age of Onset  . Heart failure Mother    Past Surgical History:  Procedure Laterality Date  . BRAIN SURGERY       Mardella LaymanHagler, Kaven Cumbie, MD 05/23/17 [phone]

## 2017-05-25 ENCOUNTER — Ambulatory Visit: Payer: Self-pay | Admitting: Nurse Practitioner

## 2017-06-22 ENCOUNTER — Ambulatory Visit: Payer: Medicaid Other | Admitting: Nurse Practitioner

## 2017-06-22 MED FILL — GABAPENTIN 300 MG CAPSULE: 300 | 30 days supply | Qty: 30 | Fill #0

## 2017-06-23 ENCOUNTER — Ambulatory Visit: Payer: Self-pay | Attending: Internal Medicine | Admitting: Physician Assistant

## 2017-06-23 VITALS — BP 130/90 | HR 66 | Temp 98.1°F | Ht 68.0 in | Wt 148.4 lb

## 2017-06-23 DIAGNOSIS — Z87820 Personal history of traumatic brain injury: Secondary | ICD-10-CM | POA: Insufficient documentation

## 2017-06-23 DIAGNOSIS — Z79899 Other long term (current) drug therapy: Secondary | ICD-10-CM | POA: Insufficient documentation

## 2017-06-23 DIAGNOSIS — R569 Unspecified convulsions: Secondary | ICD-10-CM | POA: Insufficient documentation

## 2017-06-23 DIAGNOSIS — R030 Elevated blood-pressure reading, without diagnosis of hypertension: Secondary | ICD-10-CM | POA: Insufficient documentation

## 2017-06-23 DIAGNOSIS — G629 Polyneuropathy, unspecified: Secondary | ICD-10-CM | POA: Insufficient documentation

## 2017-06-23 MED ORDER — GABAPENTIN 300 MG PO CAPS: 300.0000 mg | ORAL_CAPSULE | Freq: Every day | ORAL | 5 refills | Status: DC

## 2017-06-23 MED ORDER — LEVETIRACETAM 500 MG PO TABS: 500.0000 mg | ORAL_TABLET | Freq: Two times a day (BID) | ORAL | 5 refills | Status: DC

## 2017-06-23 MED FILL — levETIRAcetam 500 MG TABS: 500 | 30 days supply | Qty: 60 | Fill #0

## 2017-06-23 NOTE — Progress Notes (Signed)
Patient ID: Michael Oneill, male   DOB: 02/11/1976, 42 y.o.   MRN: [id number]   Michael Oneill, is a 42 y.o. male  [id number]  [id number]  DOB - 02/11/1976  Subjective:  Chief Complaint and HPI: Michael Oneill is a 42 y.o. male here today to get meds RF.  Denies any seizure activity. No problems or complaints.    ROS:   Constitutional:  No f/c, No night sweats, No unexplained weight loss. EENT:  No vision changes, No blurry vision, No hearing changes. No mouth, throat, or ear problems.  Respiratory: No cough, No SOB Cardiac: No CP, no palpitations GI:  No abd pain, No N/V/D. GU: No Urinary s/sx Musculoskeletal: No joint pain Neuro: No headache, no dizziness, no motor weakness.  Skin: No rash Endocrine:  No polydipsia. No polyuria.  Psych: Denies SI/HI  No problems updated.  ALLERGIES: No Known Allergies  PAST MEDICAL HISTORY: Past Medical History:  Diagnosis Date  . Seizures (HCC)   . TBI (traumatic brain injury) (HCC) 1997    MEDICATIONS AT HOME: Prior to Admission medications   Medication Sig Start Date End Date Taking? Authorizing Provider  gabapentin (NEURONTIN) 300 MG capsule Take 1 capsule (300 mg total) by mouth at bedtime. 05/18/17  Yes Mardella LaymanHagler, Brian, MD  levETIRAcetam (KEPPRA) 500 MG tablet Take 1 tablet (500 mg total) by mouth 2 (two) times daily. 05/18/17  Yes Mardella LaymanHagler, Brian, MD     Objective:  EXAM:   Vitals:   06/23/17 1017 06/23/17 1031  BP: (!) 143/102 130/90  Pulse: 66   Temp: 98.1 F (36.7 C)   TempSrc: Oral   SpO2: 99%   Weight: 148 lb 6.4 oz (67.3 kg)   Height: 5\' 8"  (1.727 m)     General appearance : A&OX3. NAD. Non-toxic-appearing HEENT: Atraumatic and Normocephalic.  PERRLA. EOM intact.   Neck: supple, no JVD. No cervical lymphadenopathy. No thyromegaly Chest/Lungs:  Breathing-non-labored, Good air entry bilaterally, breath sounds normal without rales, rhonchi, or wheezing  CVS: S1 S2 regular, no murmurs, gallops, rubs    Extremities: Bilateral Lower Ext shows no edema, both legs are warm to touch with = pulse throughout Neurology:  CN II-XII grossly intact, Non focal.   Psych:  Speech is muffled.  Limited eye contact.   Skin:  No Rash  Data Review Lab Results  Component Value Date   HGBA1C 5.4 06/30/2015     Assessment & Plan   1. Elevated BP without diagnosis of hypertension BP on recheck was borderline.  Reviewed BP in Epic and overall WNL.  Check BP OOO. We have discussed target BP range and blood pressure goal. I have advised patient to check BP regularly and to call us back or report to clinic if the numbers are consistently higher than 140/90. We discussed the importance of compliance with medical therapy and DASH diet recommended, consequences of uncontrolled hypertension discussed.   2. Neuropathy Gabapentin RF  3. Seizures (HCC) RF Keppra.  Check CMP and Keppra level.    Patient have been counseled extensively about nutrition and exercise  Return in about 6 months (around 12/24/2017) for [place]; f/up seizures and neuropathy.  The patient was given clear instructions to go to ER or return to medical center if symptoms don't improve, worsen or new problems develop. The patient verbalized understanding. The patient was told to call to get lab results if they haven't heard anything in the next week.     Georgian CoAngela Aileana Hodder, PA-C [facility]  [place], [place] [phone]   06/23/2017, 10:33 AM

## 2017-06-26 LAB — COMPREHENSIVE METABOLIC PANEL
ALT: 26 IU/L (ref 0–44)
AST: 90 IU/L — ABNORMAL HIGH (ref 0–40)
Albumin/Globulin Ratio: 1.9 (ref 1.2–2.2)
Albumin: 4.8 g/dL (ref 3.5–5.5)
Alkaline Phosphatase: 80 IU/L (ref 39–117)
BUN/Creatinine Ratio: 10 (ref 9–20)
BUN: 10 mg/dL (ref 6–24)
Bilirubin Total: 0.3 mg/dL (ref 0.0–1.2)
CO2: 23 mmol/L (ref 20–29)
Calcium: 9 mg/dL (ref 8.7–10.2)
Chloride: 101 mmol/L (ref 96–106)
Creatinine, Ser: 1.01 mg/dL (ref 0.76–1.27)
GFR calc Af Amer: 105 mL/min/{1.73_m2} (ref >59)
GFR calc non Af Amer: 91 mL/min/{1.73_m2} (ref >59)
Globulin, Total: 2.5 g/dL (ref 1.5–4.5)
Glucose: 73 mg/dL (ref 65–99)
Potassium: 4 mmol/L (ref 3.5–5.2)
Sodium: 141 mmol/L (ref 134–144)
Total Protein: 7.3 g/dL (ref 6.0–8.5)

## 2017-06-26 LAB — LEVETIRACETAM LEVEL: Levetiracetam Lvl: 5 ug/mL — ABNORMAL LOW (ref 10.0–40.0)

## 2017-06-29 ENCOUNTER — Telehealth: Payer: Self-pay | Admitting: General Practice

## 2017-06-29 ENCOUNTER — Telehealth: Payer: Self-pay | Admitting: *Deleted

## 2017-06-29 NOTE — Telephone Encounter (Signed)
Notes recorded by Guy FrancoBenjamin, Koston Hennes, RN on 06/29/2017 at 4:47 PM EDT Unable to reach patient. Left message to return call. ------  Notes recorded by Anders SimmondsMcClung, Angela M, PA-C on 06/28/2017 at 1:13 PM EDT Please call patient. His liver function is elevated. Tell him to avoid alcohol and tylenol products. His seizure medication level is a little low. Make sure and take seizure meds exactly as directed and lets recheck this in about 3 weeks. Thanks, Georgian CoAngela McClung, PA-C

## 2017-06-29 NOTE — Telephone Encounter (Signed)
Pt will call back again when he can, to receive his results due to a loss in his family he will be out of town and doesn't know which phone number he will be available at.

## 2017-06-30 NOTE — Telephone Encounter (Signed)
No answer, attempt to contact patient for results.

## 2017-07-04 NOTE — Telephone Encounter (Signed)
Medical Assistant left message on patient's home and cell voicemail. Voicemail states to give a call back to [place] with [facility] at [phone]. !!Please inform patient of liver function being elevated and needing to avoid alcohol and OTC tylenol. Patient needs to take seizure medication as prescribed and will need a lab recheck in three weeks!!!

## 2017-07-11 ENCOUNTER — Other Ambulatory Visit: Payer: Self-pay | Admitting: *Deleted

## 2017-07-11 DIAGNOSIS — R569 Unspecified convulsions: Secondary | ICD-10-CM

## 2017-07-11 NOTE — Telephone Encounter (Signed)
Pt name and DOB verified. Patient aware of result and result note. Aware to come in for lab test to check medication level for seizures. Pt verified understanding.

## 2017-07-14 ENCOUNTER — Ambulatory Visit: Payer: Medicaid Other | Attending: Internal Medicine

## 2017-07-14 DIAGNOSIS — R569 Unspecified convulsions: Secondary | ICD-10-CM

## 2017-07-17 LAB — LEVETIRACETAM LEVEL: Levetiracetam Lvl: 25.7 ug/mL (ref 10.0–40.0)

## 2017-07-18 ENCOUNTER — Telehealth: Payer: Self-pay | Admitting: *Deleted

## 2017-07-18 NOTE — Telephone Encounter (Signed)
-----   Message from Anders SimmondsAngela M McClung, [place] sent at 07/18/2017  8:36 AM EDT ----- Please call patient.  Keppra levels are int he therapeutic range.  Continue current dose of Keppra.  Follow-up as planned. Thanks, Georgian CoAngela McClung, PA-C

## 2017-07-18 NOTE — Telephone Encounter (Signed)
Medical Assistant left message on patient's home and cell voicemail. Voicemail states to give a call back to [place] [facility] at [phone]. !!Please inform patient of seizure levels being in therapeutic range and to continue the current dose of keppra. Patient should follow up as planned!!!

## 2017-08-09 IMAGING — CT CT HEAD W/O CM
1 series · 15 of 30 positions shown, 19 images · non-contrast
Comparison: Report of CT head without contrast 05/11/1999.

CLINICAL DATA: Right-sided headache for 1 week with facial numbness
and blurred vision.

EXAM:
CT HEAD WITHOUT CONTRAST
TECHNIQUE: Contiguous axial images were obtained from the base of the skull
through the vertex without intravenous contrast.

[Series 2: head 5.0 h30s · axial · 0.45mm/px · z∈[-104,+36]mm · 15 of 32 slices shown, 19 images]
[im 2/32  brain]
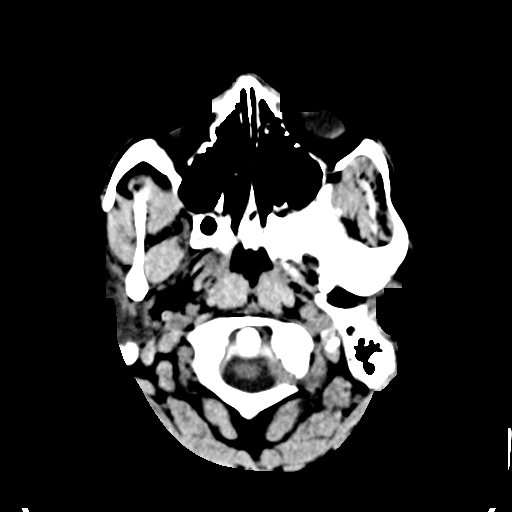
[im 2/32  bone]
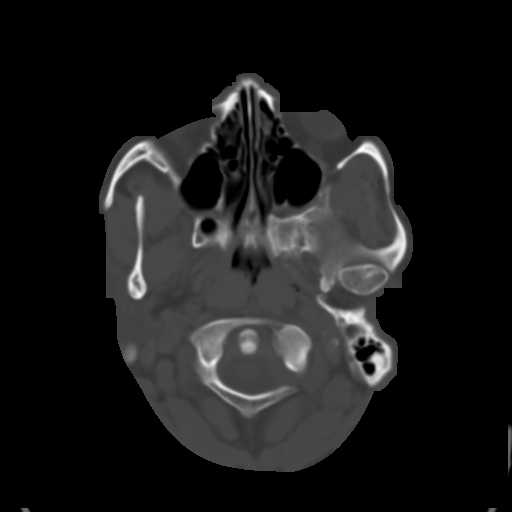
[im 4/32  brain]
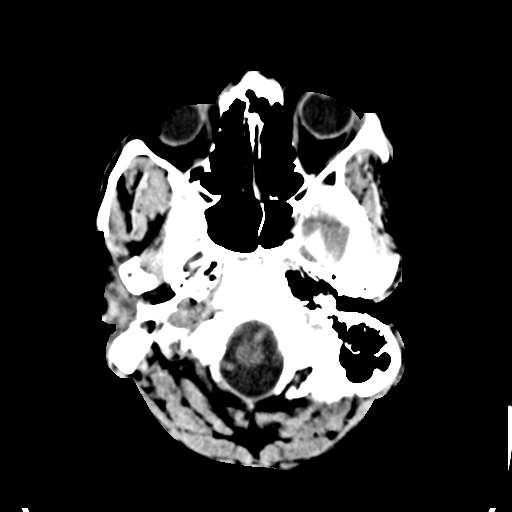
[im 6/32  brain]
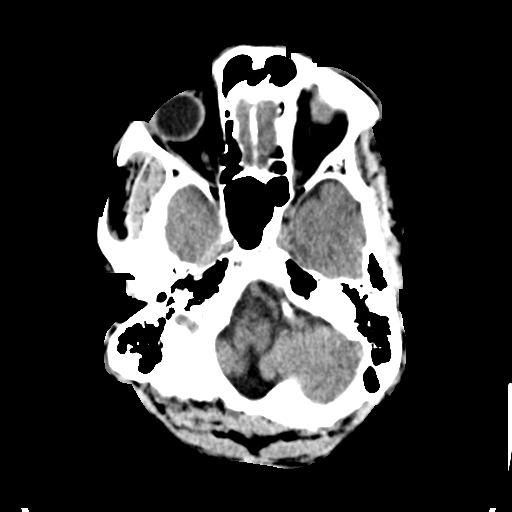
[im 8/32  brain]
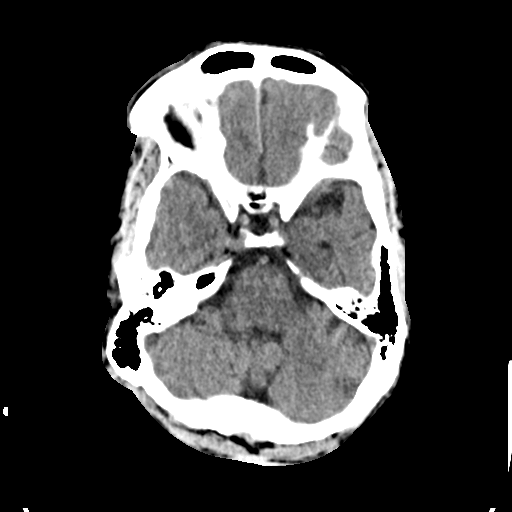
[im 10/32  brain]
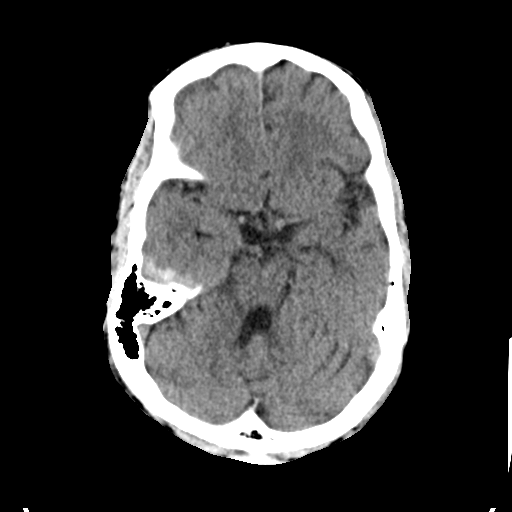
[im 10/32  bone]
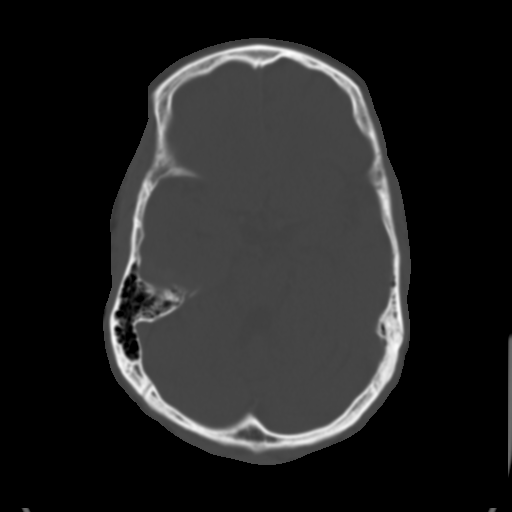
[im 12/32  brain]
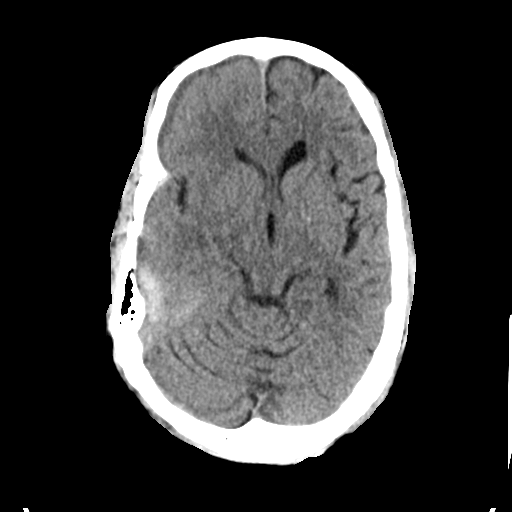
[im 14/32  brain]
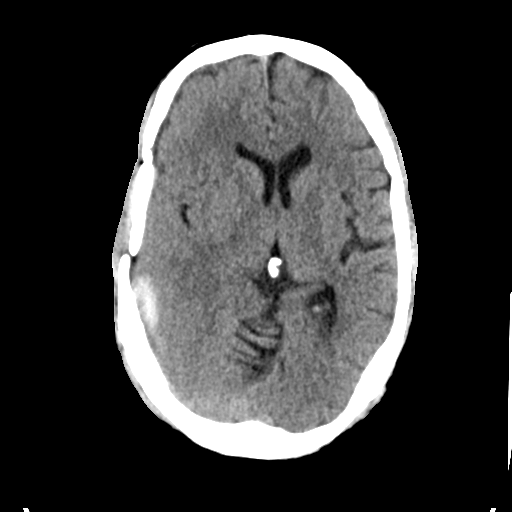
[im 17/32  brain]
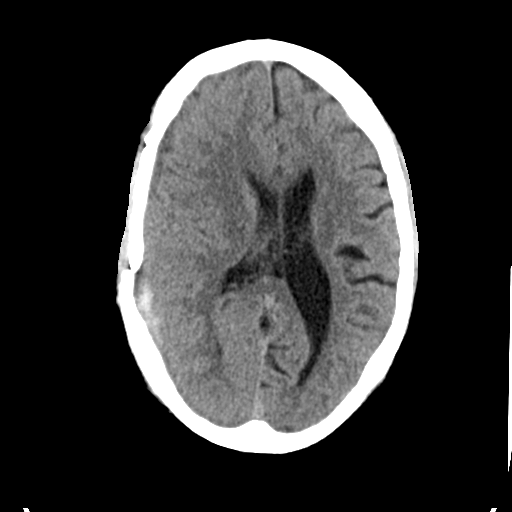
[im 18/32  brain]
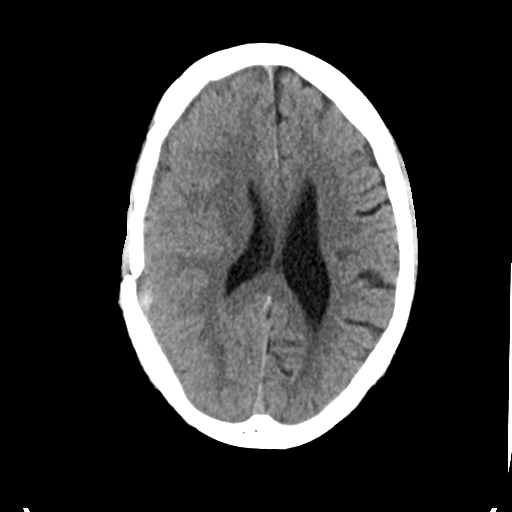
[im 18/32  bone]
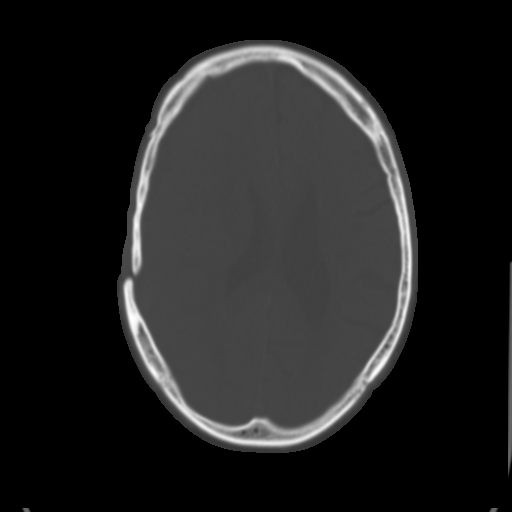
[im 20/32  brain]
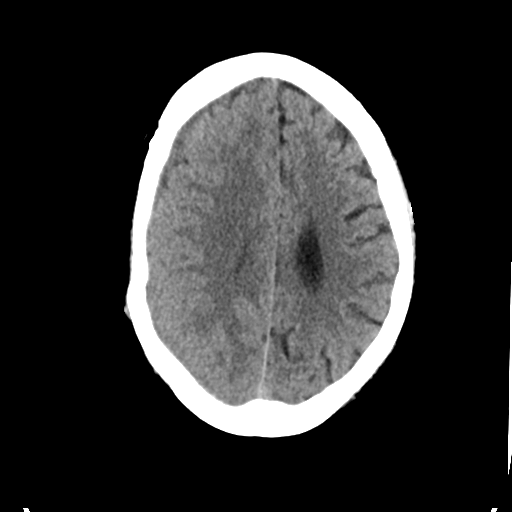
[im 22/32  brain]
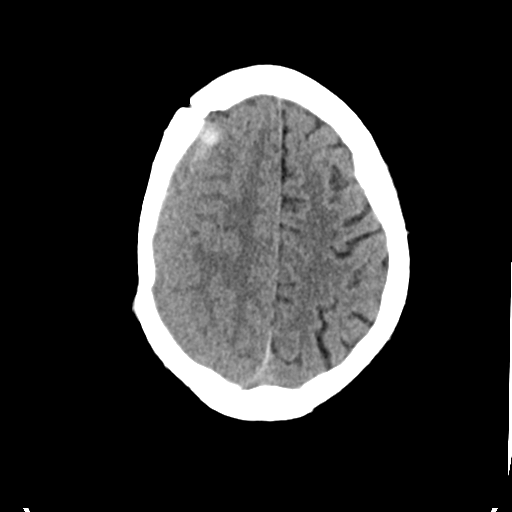
[im 24/32  brain]
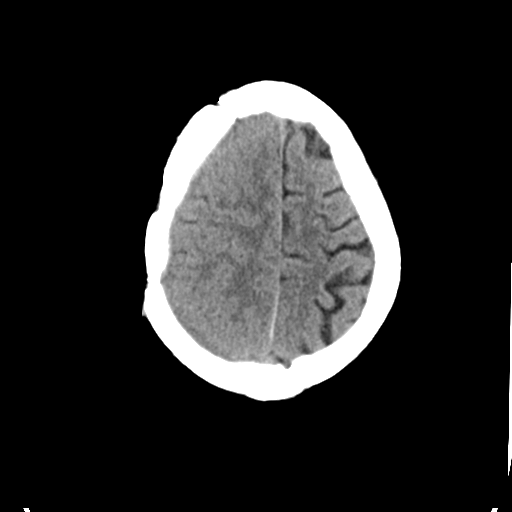
[im 26/32  brain]
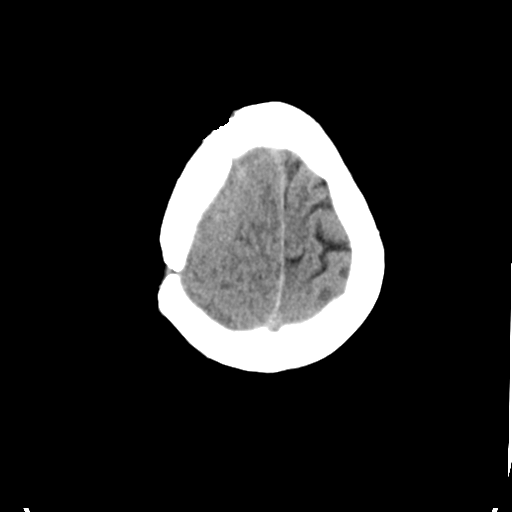
[im 26/32  bone]
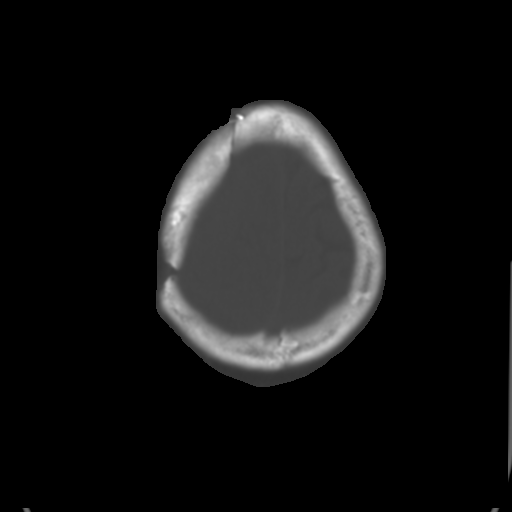
[im 28/32  brain]
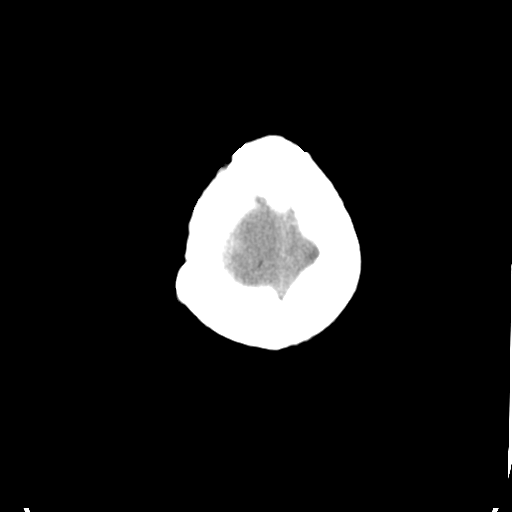
[im 30/32  brain]
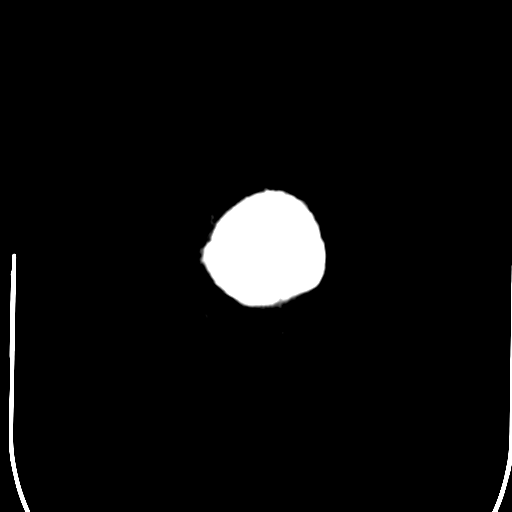

[15 of 30 positions shown; findings below may reference images not displayed]

FINDINGS: An acute right extra-axial fluid collection is present. This extends
over the right convexity. Areas of hyperdense bladder most evident
adjacent to the right parietal and temporal lobe extending into the
right middle cranial fossa and more anteriorly over the anterior
right frontal convexity. There is mass effect with effacement of the
sulci and crowding of the cortex. Midline shift measures 6 mm at the
foramen of Polania.

No cortical infarct is present. There is no left-sided hemorrhage.
There is partial effacement of the right lateral ventricle.
Posterior fossa structures are intact.

A remote right-sided craniotomy is again noted. The calvarium is
otherwise intact. No significant extracranial soft tissue lesions
are present. The globes and orbits are within normal limits. The
paranasal sinuses and mastoid air cells are clear.
IMPRESSION: 1. Acute/subacute right subdural hematoma with mass effect and
midline shift as described. The maximum width of the hemorrhage is 8
mm.
2. 6 mm midline shift at the foramen of Polania.

These results were called by telephone at the time of interpretation
on 05/31/2015 at [DATE] to Dr. ABIMELK TIGER , who verbally
acknowledged these results.

## 2017-08-13 IMAGING — CR DG CHEST 1V PORT
1 series · 1 of 1 positions shown · non-contrast
Comparison: Portable exam 3603 hours compared to 06/03/2015

CLINICAL DATA: Respiratory failure, smoker

EXAM:
PORTABLE CHEST 1 VIEW

[AP]
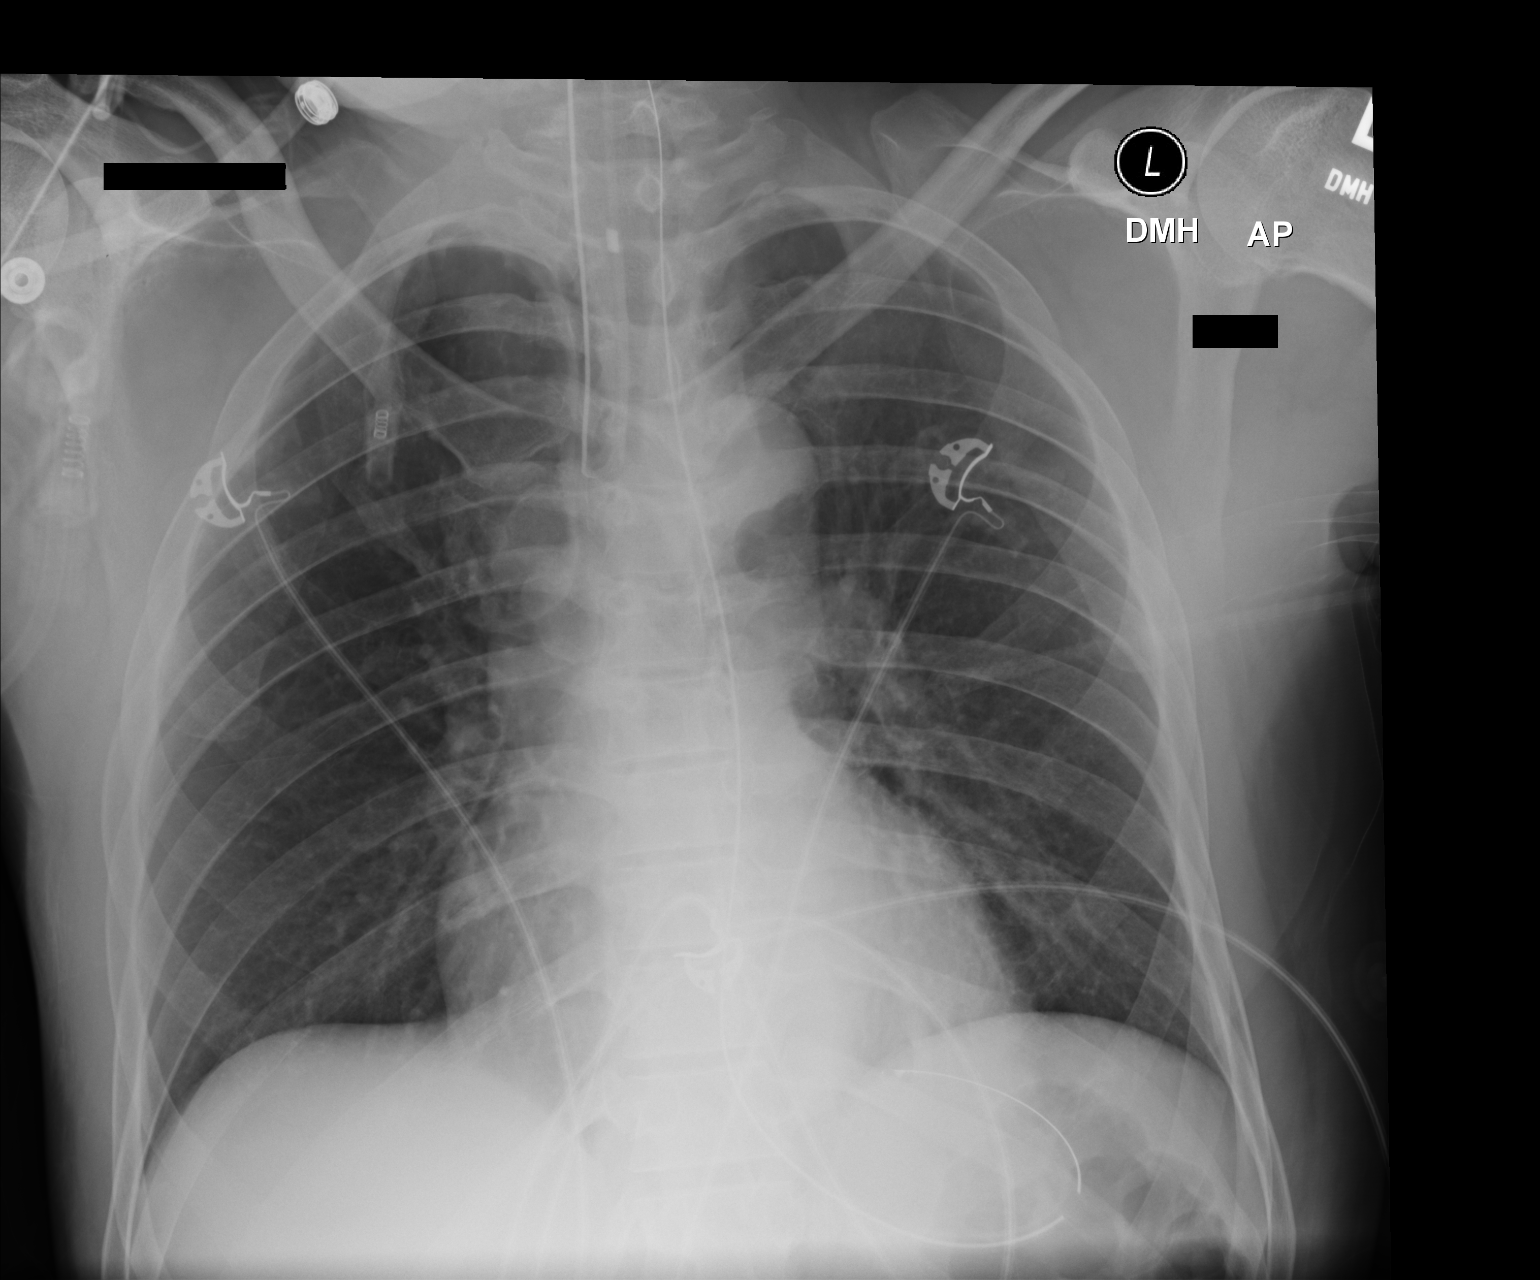

[1 of 1 positions shown; findings below may reference images not displayed]

FINDINGS: Tip of endotracheal tube projects 3.3 cm above carina.

Nasogastric tube coiled in proximal stomach.

Normal heart size, mediastinal contours, and pulmonary vascularity.

Lungs clear.

No pleural effusion or pneumothorax.

Bones unremarkable.
IMPRESSION: No acute abnormalities.

## 2017-08-31 ENCOUNTER — Telehealth: Payer: Self-pay | Admitting: General Practice

## 2017-08-31 MED FILL — levETIRAcetam 500 MG TABS: 500 | 30 days supply | Qty: 60 | Fill #1

## 2017-08-31 MED FILL — GABAPENTIN 300 MG CAPSULE: 300 | 30 days supply | Qty: 30 | Fill #1

## 2017-09-08 ENCOUNTER — Ambulatory Visit: Payer: Self-pay | Attending: Internal Medicine | Admitting: Internal Medicine

## 2017-09-08 ENCOUNTER — Encounter: Payer: Self-pay | Admitting: Internal Medicine

## 2017-09-08 VITALS — BP 118/79 | HR 87 | Temp 98.5°F | Resp 16 | Ht 67.0 in | Wt 145.6 lb

## 2017-09-08 DIAGNOSIS — Z9889 Other specified postprocedural states: Secondary | ICD-10-CM | POA: Insufficient documentation

## 2017-09-08 DIAGNOSIS — F17200 Nicotine dependence, unspecified, uncomplicated: Secondary | ICD-10-CM

## 2017-09-08 DIAGNOSIS — F17210 Nicotine dependence, cigarettes, uncomplicated: Secondary | ICD-10-CM | POA: Insufficient documentation

## 2017-09-08 DIAGNOSIS — Z114 Encounter for screening for human immunodeficiency virus [HIV]: Secondary | ICD-10-CM | POA: Insufficient documentation

## 2017-09-08 DIAGNOSIS — Z8249 Family history of ischemic heart disease and other diseases of the circulatory system: Secondary | ICD-10-CM | POA: Insufficient documentation

## 2017-09-08 DIAGNOSIS — R7989 Other specified abnormal findings of blood chemistry: Secondary | ICD-10-CM

## 2017-09-08 DIAGNOSIS — Z79899 Other long term (current) drug therapy: Secondary | ICD-10-CM | POA: Insufficient documentation

## 2017-09-08 DIAGNOSIS — R945 Abnormal results of liver function studies: Secondary | ICD-10-CM | POA: Insufficient documentation

## 2017-09-08 DIAGNOSIS — G629 Polyneuropathy, unspecified: Secondary | ICD-10-CM | POA: Insufficient documentation

## 2017-09-08 DIAGNOSIS — R531 Weakness: Secondary | ICD-10-CM | POA: Insufficient documentation

## 2017-09-08 DIAGNOSIS — G40909 Epilepsy, unspecified, not intractable, without status epilepticus: Secondary | ICD-10-CM | POA: Insufficient documentation

## 2017-09-08 NOTE — Patient Instructions (Signed)

## 2017-09-08 NOTE — Progress Notes (Signed)
Patient ID: Michael Oneill, male    DOB: 1976/04/17  MRN: [id number]  CC: re-establish   Subjective: Michael Oneill is a 42 y.o. male who presents for chronic ds management His concerns today include:  Sz disorder,  subdural hematoma and tobacco  Pt with hx of what sounds like tonic clonic sz since 1988.  Then had brain injury in 2017 which was work related-  Last sz was in Feb 2019.  He does not drive C/o weakness and numbness in RT leg and arm x 6-8 mths.  States he is on Neurontin for this reason.  Wants to apply for disability. Last CAT scan of the head was done in July 2017 that revealed mild age advanced atrophy unchanged.  Drinks three 16 oz beers a day.  No street drugs.  1pk cig Q7 days.  Not ready to quit Patient Active Problem List   Diagnosis Date Noted  . Neuropathy 06/30/2015  . Acute encephalopathy 06/06/2015  . Acute respiratory failure with hypoxia (HCC) 06/04/2015  . Seizures (HCC) 06/03/2015  . Subdural hematoma (HCC) 05/31/2015  . INGUINAL HERNIA 02/06/2007  . HERNIORRHAPHY, HX OF 02/06/2007     Current Outpatient Medications on File Prior to Visit  Medication Sig Dispense Refill  . gabapentin (NEURONTIN) 300 MG capsule Take 1 capsule (300 mg total) by mouth at bedtime. 30 capsule 5  . levETIRAcetam (KEPPRA) 500 MG tablet Take 1 tablet (500 mg total) by mouth 2 (two) times daily. 60 tablet 5   No current facility-administered medications on file prior to visit.     No Known Allergies  Social History   Socioeconomic History  . Marital status: Single    Spouse name: Not on file  . Number of children: Not on file  . Years of education: Not on file  . Highest education level: Not on file  Occupational History  . Not on file  Social Needs  . Financial resource strain: Not on file  . Food insecurity:    Worry: Not on file    Inability: Not on file  . Transportation needs:    Medical: Not on file    Non-medical: Not on file  Tobacco Use  .  Smoking status: Current Every Day Smoker    Packs/day: 0.25  . Smokeless tobacco: Never Used  Substance and Sexual Activity  . Alcohol use: Yes    Comment: OCC  . Drug use: No  . Sexual activity: Not on file  Lifestyle  . Physical activity:    Days per week: Not on file    Minutes per session: Not on file  . Stress: Not on file  Relationships  . Social connections:    Talks on phone: Not on file    Gets together: Not on file    Attends religious service: Not on file    Active member of club or organization: Not on file    Attends meetings of clubs or organizations: Not on file    Relationship status: Not on file  . Intimate partner violence:    Fear of current or ex partner: Not on file    Emotionally abused: Not on file    Physically abused: Not on file    Forced sexual activity: Not on file  Other Topics Concern  . Not on file  Social History Narrative  . Not on file    Family History  Problem Relation Age of Onset  . Heart failure Mother     Past Surgical  History:  Procedure Laterality Date  . BRAIN SURGERY      ROS: Review of Systems Negative except as stated above PHYSICAL EXAM: BP 118/79   Pulse 87   Temp 98.5 F (36.9 C) (Oral)   Resp 16   Ht  (1.702 m)   Wt 145 lb 9.6 oz (66 kg)   SpO2 97%   BMI 22.80 kg/m   Wt Readings from Last 3 Encounters:  09/08/17 145 lb 9.6 oz (66 kg)  06/23/17 148 lb 6.4 oz (67.3 kg)  04/17/17 145 lb (65.8 kg)    Physical Exam  General appearance - alert, well appearing, and in no distress Mental status -oriented to place and time Chest - clear to auscultation, no wheezes, rales or rhonchi, symmetric air entry Heart - normal rate, regular rhythm, normal S1, S2, no murmurs, rubs, clicks or gallops Neurological -no nystagmus.  Power in upper extremities 5 out of 5 bilaterally. Power lower extremities 4+ out of 5 on the right, 5/5 on the left. Knee and ankle jerk reflexes are brisk. Subjective decreased sensation  to gross touch on the right lower leg Extremities - no lower extremity edema    Chemistry      Component Value Date/Time   NA 141 06/23/2017 1037   K 4.0 06/23/2017 1037   CL 101 06/23/2017 1037   CO2 23 06/23/2017 1037   BUN 10 06/23/2017 1037   CREATININE 1.01 06/23/2017 1037      Component Value Date/Time   CALCIUM 9.0 06/23/2017 1037   ALKPHOS 80 06/23/2017 1037   AST 90 (H) 06/23/2017 1037   ALT 26 06/23/2017 1037   BILITOT 0.3 06/23/2017 1037      ASSESSMENT AND PLAN: 1. Seizure disorder (HCC) Continue Keppra. Would like to get him in with the neurologist both for the seizure disorder and the weakness in the right leg. Advised to apply for the [facility] card/cone discount card. Will have him follow-up in about 7 weeks. If he has been approved we will submit the referral - CBC  2. Tobacco dependence Patient advised to quit smoking. Discussed health risks associated with smoking including lung and other types of cancers, chronic lung diseases and CV risks.. Pt not ready to give trail of quitting.    3. Neuropathy I question whether this is related to his brain injury or whether it is new. In any event it has been present for over 6 months and he is on Neurontin to help decrease the numbness in the leg. - Vitamin B12  4. Abnormal LFTs Encourage him to cut back on the amount of beer that he drinks. He will try to cut back to one a day - Hepatic Function Panel - Hepatitis C Antibody  5. Screening for HIV (human immunodeficiency virus) - HIV antibody   Patient was given the opportunity to ask questions.  Patient verbalized understanding of the plan and was able to repeat key elements of the plan.   Orders Placed This Encounter  Procedures  . Hepatic Function Panel  . Hepatitis C Antibody  . HIV antibody  . CBC  . Vitamin B12     Requested Prescriptions    No prescriptions requested or ordered in this encounter    Return in about 7 weeks (around  10/27/2017).  Jonah Blue, MD, FACP

## 2017-09-09 ENCOUNTER — Telehealth: Payer: Self-pay

## 2017-09-09 LAB — CBC
Hematocrit: 39.2 % (ref 37.5–51.0)
Hemoglobin: 13.7 g/dL (ref 13.0–17.7)
MCH: 33.7 pg — ABNORMAL HIGH (ref 26.6–33.0)
MCHC: 34.9 g/dL (ref 31.5–35.7)
MCV: 96 fL (ref 79–97)
Platelets: 191 10*3/uL (ref 150–450)
RBC: 4.07 x10E6/uL — ABNORMAL LOW (ref 4.14–5.80)
RDW: 15.2 % (ref 12.3–15.4)
WBC: 4.4 10*3/uL (ref 3.4–10.8)

## 2017-09-09 LAB — HEPATIC FUNCTION PANEL
ALT: 29 IU/L (ref 0–44)
AST: 100 IU/L — ABNORMAL HIGH (ref 0–40)
Albumin: 4.8 g/dL (ref 3.5–5.5)
Alkaline Phosphatase: 109 IU/L (ref 39–117)
Bilirubin Total: <0.2 mg/dL (ref 0.0–1.2)
Bilirubin, Direct: 0.08 mg/dL (ref 0.00–0.40)
Total Protein: 7.2 g/dL (ref 6.0–8.5)

## 2017-09-09 LAB — HEPATITIS C ANTIBODY: Hep C Virus Ab: <0.1 s/co ratio (ref 0.0–0.9)

## 2017-09-09 LAB — VITAMIN B12: Vitamin B-12: 505 pg/mL (ref 232–1245)

## 2017-09-09 LAB — HIV ANTIBODY (ROUTINE TESTING W REFLEX): HIV Screen 4th Generation wRfx: NONREACTIVE

## 2017-09-09 NOTE — Telephone Encounter (Signed)
Contacted pt to go over lab results pt didn't answer lvm asking pt to give me a call at his earliest convenience  If pt calls back please give results: liver enzymes still elevated. We will observe for now. Try to cut back on the amount of beer he drinks per day. HIV and hep C screens were negative. Blood count was okay.

## 2017-10-28 ENCOUNTER — Encounter: Payer: Self-pay | Admitting: Internal Medicine

## 2017-10-28 ENCOUNTER — Ambulatory Visit: Payer: Self-pay | Attending: Internal Medicine | Admitting: Internal Medicine

## 2017-10-28 VITALS — BP 164/104 | HR 72 | Temp 99.4°F | Resp 16 | Wt 145.6 lb

## 2017-10-28 DIAGNOSIS — F17210 Nicotine dependence, cigarettes, uncomplicated: Secondary | ICD-10-CM | POA: Insufficient documentation

## 2017-10-28 DIAGNOSIS — G40909 Epilepsy, unspecified, not intractable, without status epilepticus: Secondary | ICD-10-CM | POA: Insufficient documentation

## 2017-10-28 DIAGNOSIS — R030 Elevated blood-pressure reading, without diagnosis of hypertension: Secondary | ICD-10-CM | POA: Insufficient documentation

## 2017-10-28 DIAGNOSIS — Z79899 Other long term (current) drug therapy: Secondary | ICD-10-CM | POA: Insufficient documentation

## 2017-10-28 MED ORDER — AMLODIPINE BESYLATE 5 MG PO TABS: 5.0000 mg | ORAL_TABLET | Freq: Every day | ORAL | 5 refills | Status: DC

## 2017-10-28 NOTE — Patient Instructions (Signed)
Nurse only BP check in 2 weeks.  

## 2017-10-28 NOTE — Progress Notes (Signed)
Patient ID: Michael Oneill, male    DOB: 05/13/1975  MRN: [id number]  CC: Follow-up   Subjective: Michael Oneill is a 42 y.o. male who presents for chronic ds mnagement His concerns today include:  Sz disorder,  subdural hematoma and tobacco dependence  SZ:  No sz since last visit.  Reports compliance with Keppra.  On last visit patient was advised to apply for the orange card/cone discount card so that we can refer to neurology for seizure disorder and evaluation of weakness in the right lower extremity.  However patient has not done so as yet.  Blood pressure noted to be elevated today.  He will reports taking BCs occasionally.  Last took one about 4 days ago.  Denies any chronic headaches or dizziness.  No blurred vision.  No chest pains or shortness of breath.  He denies any street drug use.   Patient Active Problem List   Diagnosis Date Noted  . Neuropathy 06/30/2015  . Acute encephalopathy 06/06/2015  . Acute respiratory failure with hypoxia (HCC) 06/04/2015  . Seizures (HCC) 06/03/2015  . Subdural hematoma (HCC) 05/31/2015  . INGUINAL HERNIA 02/06/2007  . HERNIORRHAPHY, HX OF 02/06/2007     Current Outpatient Medications on File Prior to Visit  Medication Sig Dispense Refill  . gabapentin (NEURONTIN) 300 MG capsule Take 1 capsule (300 mg total) by mouth at bedtime. 30 capsule 5  . levETIRAcetam (KEPPRA) 500 MG tablet Take 1 tablet (500 mg total) by mouth 2 (two) times daily. 60 tablet 5   No current facility-administered medications on file prior to visit.     No Known Allergies  Social History   Socioeconomic History  . Marital status: Single    Spouse name: Not on file  . Number of children: Not on file  . Years of education: Not on file  . Highest education level: Not on file  Occupational History  . Not on file  Social Needs  . Financial resource strain: Not on file  . Food insecurity:    Worry: Not on file    Inability: Not on file  .  Transportation needs:    Medical: Not on file    Non-medical: Not on file  Tobacco Use  . Smoking status: Current Every Day Smoker    Packs/day: 0.25  . Smokeless tobacco: Never Used  Substance and Sexual Activity  . Alcohol use: Yes    Comment: OCC  . Drug use: No  . Sexual activity: Not on file  Lifestyle  . Physical activity:    Days per week: Not on file    Minutes per session: Not on file  . Stress: Not on file  Relationships  . Social connections:    Talks on phone: Not on file    Gets together: Not on file    Attends religious service: Not on file    Active member of club or organization: Not on file    Attends meetings of clubs or organizations: Not on file    Relationship status: Not on file  . Intimate partner violence:    Fear of current or ex partner: Not on file    Emotionally abused: Not on file    Physically abused: Not on file    Forced sexual activity: Not on file  Other Topics Concern  . Not on file  Social History Narrative  . Not on file    Family History  Problem Relation Age of Onset  . Heart failure Mother  Past Surgical History:  Procedure Laterality Date  . BRAIN SURGERY      ROS: Review of Systems Negative except as stated above. PHYSICAL EXAM: BP (!) 164/104 (BP Location: Left Arm, Cuff Size: Normal)   Pulse 72   Temp 99.4 F (37.4 C) (Oral)   Resp 16   Wt 145 lb 9.6 oz (66 kg)   SpO2 99%   BMI 22.80 kg/m   Repeat BP 160/100 Physical Exam  General appearance - alert, well appearing, and in no distress Mental status - normal mood, behavior, speech, dress, motor activity, and thought processes Neck - supple, no significant adenopathy Chest - clear to auscultation, no wheezes, rales or rhonchi, symmetric air entry Heart - normal rate, regular rhythm, normal S1, S2, no murmurs, rubs, clicks or gallops Extremities - peripheral pulses normal, no pedal edema, no clubbing or cyanosis  Results for orders placed or performed in  visit on 09/08/17  Hepatic Function Panel  Result Value Ref Range   Total Protein 7.2 6.0 - 8.5 g/dL   Albumin 4.8 3.5 - 5.5 g/dL   Bilirubin Total <1.6<0.2 0.0 - 1.2 mg/dL   Bilirubin, Direct 1.090.08 0.00 - 0.40 mg/dL   Alkaline Phosphatase 109 39 - 117 IU/L   AST 100 (H) 0 - 40 IU/L   ALT 29 0 - 44 IU/L  Hepatitis C Antibody  Result Value Ref Range   Hep C Virus Ab <0.1 0.0 - 0.9 s/co ratio  HIV antibody  Result Value Ref Range   HIV Screen 4th Generation wRfx Non Reactive Non Reactive  CBC  Result Value Ref Range   WBC 4.4 3.4 - 10.8 x10E3/uL   RBC 4.07 (L) 4.14 - 5.80 x10E6/uL   Hemoglobin 13.7 13.0 - 17.7 g/dL   Hematocrit 60.439.2 54.037.5 - 51.0 %   MCV 96 79 - 97 fL   MCH 33.7 (H) 26.6 - 33.0 pg   MCHC 34.9 31.5 - 35.7 g/dL   RDW 98.115.2 19.112.3 - 47.815.4 %   Platelets 191 150 - 450 x10E3/uL  Vitamin B12  Result Value Ref Range   Vitamin B-12 505 232 - 1,245 pg/mL    ASSESSMENT AND PLAN: 1. Seizure disorder (HCC) Continue Keppra and gabapentin. He will apply for the orange card  2. Elevated blood pressure reading New.  Given that both top and bottom numbers are elevated we will start him on low-dose of amlodipine.  DASH diet discussed and encouraged. Nurse only blood pressure check in 2 weeks. - amLODipine (NORVASC) 5 MG tablet; Take 1 tablet (5 mg total) by mouth daily.  Dispense: 30 tablet; Refill: 5   Patient was given the opportunity to ask questions.  Patient verbalized understanding of the plan and was able to repeat key elements of the plan.   No orders of the defined types were placed in this encounter.    Requested Prescriptions   Signed Prescriptions Disp Refills  . amLODipine (NORVASC) 5 MG tablet 30 tablet 5    Sig: Take 1 tablet (5 mg total) by mouth daily.    Return in about 3 months (around 01/28/2018).  Jonah Blueeborah Hally Colella, MD, FACP

## 2017-11-11 ENCOUNTER — Ambulatory Visit: Payer: Self-pay | Attending: Family Medicine | Admitting: *Deleted

## 2017-11-11 VITALS — BP 170/119 | HR 87

## 2017-11-11 DIAGNOSIS — Z0130 Encounter for examination of blood pressure without abnormal findings: Secondary | ICD-10-CM | POA: Insufficient documentation

## 2017-11-11 DIAGNOSIS — R030 Elevated blood-pressure reading, without diagnosis of hypertension: Secondary | ICD-10-CM

## 2017-11-11 MED FILL — AMLODIPINE BESYLATE 5 MG TA: 5 | 30 days supply | Qty: 30 | Fill #0

## 2017-11-11 MED FILL — GABAPENTIN 300 MG CAPSULE: 300 | 30 days supply | Qty: 30 | Fill #2

## 2017-11-11 MED FILL — levETIRAcetam 500 MG TABS: 500 | 30 days supply | Qty: 60 | Fill #2

## 2017-11-11 NOTE — Progress Notes (Signed)
[facility] Pt alert and oriented, arrives in good spirits. At his last OV,  with Dr. Laural BenesJohnson his blood pressure was 164/104.   Pt denies chest pain, SOB, HA, dizziness, or blurred vision. Verified medication. Pt states he was able to pick medication up today from the pharmacy. He took 1 tablet prior to visit today.  Blood pressure reading: 170/110. Informed Michael IdeFleming, FNP of blood pressure reading. Pt was advised to return in 2 weeks to recheck blood pressure.

## 2017-11-24 NOTE — Progress Notes (Signed)
   S:    PCP: Dr. Laural BenesJohnson  Patient arrives in good spirits.  Presents to the clinic for hypertension evaluation, counseling, and management. Patient was last seen and referred by Dr. Laural BenesJohnson on 10/28/17. Pt denies CP, SOB, dizziness, HA.   HPI:  - Nurse visit for BP re-check (11/11/17): 170/119. Not seen by provider. - PCP visit (10/28/17): Seen by Dr. Laural BenesJohnson. BP noted to be elevated at 164/104. She started amlodipine 5 mg daily. Lifestyle reinforced.  Patient reports adherence with amlodipine. Of note, he did miss one dose 11/19/17. He does note report any problems with the amlodipine.   Current BP Medications include:   - amlodipine 5 mg (started 10/28/17)  Antihypertensives tried in the past include:  - none  Dietary habits include:  - Sodium: Reports eating fried chicken today. Does not limit salt.  - Caffeine: Reports drinking Mt Dew daily.  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  Family / Social history:  - Tobacco: current 1/4 pack per day smoker - Alcohol:  Occasional use  O:  Office visit: L arm after 5 minutes rest 151/91, HR 91  Last 3 Office BP readings: BP Readings from Last 3 Encounters:  11/11/17 (!) 170/119  10/28/17 (!) 164/104  09/08/17 118/79    BMET    Component Value Date/Time   NA 141 06/23/2017 1037   K 4.0 06/23/2017 1037   CL 101 06/23/2017 1037   CO2 23 06/23/2017 1037   GLUCOSE 73 06/23/2017 1037   GLUCOSE 88 11/16/2016 1431   BUN 10 06/23/2017 1037   CREATININE 1.01 06/23/2017 1037   CALCIUM 9.0 06/23/2017 1037   GFRNONAA 91 06/23/2017 1037   GFRAA 105 06/23/2017 1037   Renal function: CrCl cannot be calculated (Patient's most recent lab result is older than the maximum 21 days allowed.).  A/P: Hypertension newly diagnosed currently uncontrolled on amlodipine 5 mg daily. BP Goal <130/80 mmHg. Patient is adherent with current medications.   -Increased dose of  amlodipine from 5 mg to 10 mg daily. -Counseled on lifestyle modifications for blood pressure control including reduced dietary sodium/caffeine intake, increased exercise, adequate sleep  Results reviewed and written information provided.   Total time in face-to-face counseling 15 minutes.   F/U PharmD visit in 1 month.    Patient seen with  Mosie LukesMichele Muir, PharmD Candidate [facility] [employer]  Butch PennyLuke Van Ausdall, PharmD, CPP Clinical Pharmacist [facility] [phone]

## 2017-11-25 ENCOUNTER — Encounter: Payer: Self-pay | Admitting: Pharmacist

## 2017-11-25 ENCOUNTER — Ambulatory Visit: Payer: Self-pay | Attending: Internal Medicine | Admitting: Pharmacist

## 2017-11-25 VITALS — BP 151/91 | HR 91

## 2017-11-25 DIAGNOSIS — I10 Essential (primary) hypertension: Secondary | ICD-10-CM | POA: Insufficient documentation

## 2017-11-25 DIAGNOSIS — F17200 Nicotine dependence, unspecified, uncomplicated: Secondary | ICD-10-CM | POA: Insufficient documentation

## 2017-11-25 MED ORDER — AMLODIPINE BESYLATE 10 MG PO TABS: 10.0000 mg | ORAL_TABLET | Freq: Every day | ORAL | 2 refills | Status: DC

## 2017-11-25 MED FILL — AMLODIPINE BESYLATE 10 MG T: 10 | 30 days supply | Qty: 30 | Fill #0

## 2017-11-25 NOTE — Patient Instructions (Addendum)
Thanks for coming to see us today. Blood pressure is looking better. We still want you under 130/80, so we are increasing from 5 mg to 10 mg of amlodipine.   Please make an appointment to see us again in 1 month.

## 2017-12-26 ENCOUNTER — Encounter: Payer: Medicaid Other | Admitting: Pharmacist

## 2018-01-06 ENCOUNTER — Encounter: Payer: Medicaid Other | Admitting: Pharmacist

## 2018-01-12 ENCOUNTER — Encounter: Payer: Medicaid Other | Admitting: Pharmacist

## 2018-01-13 MED FILL — levETIRAcetam 500 MG TABS: 500 | 30 days supply | Qty: 60 | Fill #3

## 2018-01-13 MED FILL — AMLODIPINE BESYLATE 10 MG T: 10 | 30 days supply | Qty: 30 | Fill #1

## 2018-01-13 MED FILL — GABAPENTIN 300 MG CAPSULE: 300 | 30 days supply | Qty: 30 | Fill #3

## 2018-01-16 ENCOUNTER — Ambulatory Visit: Payer: Self-pay | Attending: Family Medicine | Admitting: Pharmacist

## 2018-01-16 ENCOUNTER — Encounter: Payer: Self-pay | Admitting: Pharmacist

## 2018-01-16 VITALS — BP 112/73 | HR 73

## 2018-01-16 DIAGNOSIS — I10 Essential (primary) hypertension: Secondary | ICD-10-CM | POA: Insufficient documentation

## 2018-01-16 NOTE — Patient Instructions (Signed)

## 2018-01-16 NOTE — Progress Notes (Signed)
   S:    PCP: Dr. Laural Benes  Patient arrives in good spirits.  Presents to the clinic for hypertension management. Patient was last seen and referred by Dr. Laural Benes on 10/28/17. I last saw him 11/25/17. BP elevated at 151/91. Amlodipine was increased to 10 mg daily.  Today, he denies CP, SOB, HA, or blurred vision. No LE edema. Patient reports adherence with amlodipine.   Current BP Medications include:   - amlodipine 10 mg   Antihypertensives tried in the past include:  - none  Dietary habits include:  - Sodium: Does not limit salt.  - Caffeine: Has eliminated soda from diet Exercise habits include: - Physical activity: "I walk all the time" Family / Social history:  - Tobacco: 5 cigarettes/day - Alcohol:  6 drinks a day  O:  Office visit: L arm after 5 minutes rest 112/73, HR 73  Last 3 Office BP readings: BP Readings from Last 3 Encounters:  11/25/17 (!) 151/91  11/11/17 (!) 170/119  10/28/17 (!) 164/104   BMET    Component Value Date/Time   NA 141 06/23/2017 1037   K 4.0 06/23/2017 1037   CL 101 06/23/2017 1037   CO2 23 06/23/2017 1037   GLUCOSE 73 06/23/2017 1037   GLUCOSE 88 11/16/2016 1431   BUN 10 06/23/2017 1037   CREATININE 1.01 06/23/2017 1037   CALCIUM 9.0 06/23/2017 1037   GFRNONAA 91 06/23/2017 1037   GFRAA 105 06/23/2017 1037   Renal function: CrCl cannot be calculated (Patient's most recent lab result is older than the maximum 21 days allowed.).  A/P: Hypertension longstanding currently controlled on amlodipine 10 mg daily. BP Goal <130/80 mmHg. Patient is adherent with current medications.   - Continued amlodipine 10 mg daily. -Counseled on lifestyle modifications for blood pressure control including reduced dietary sodium/caffeine intake, increased exercise, adequate sleep  Results reviewed and written information provided. Total time in face-to-face counseling 15 minutes.  * F/U with PCP 02/03/18.  Butch Penny, PharmD, CPP Clinical  Pharmacist [facility] [phone]

## 2018-02-03 ENCOUNTER — Ambulatory Visit: Payer: Medicaid Other | Admitting: Internal Medicine

## 2018-02-24 ENCOUNTER — Ambulatory Visit: Payer: Medicaid Other | Admitting: Internal Medicine

## 2018-02-28 ENCOUNTER — Ambulatory Visit: Payer: Medicaid Other | Admitting: Internal Medicine

## 2018-03-28 ENCOUNTER — Ambulatory Visit: Payer: Medicaid Other | Admitting: Internal Medicine

## 2018-04-03 ENCOUNTER — Other Ambulatory Visit: Payer: Self-pay | Admitting: Internal Medicine

## 2018-04-03 DIAGNOSIS — G629 Polyneuropathy, unspecified: Secondary | ICD-10-CM

## 2018-04-03 DIAGNOSIS — R569 Unspecified convulsions: Secondary | ICD-10-CM

## 2018-04-03 NOTE — Telephone Encounter (Signed)
1) Medication(s) Requested (by name): -amLODipine (NORVASC) 10 MG tablet  -gabapentin (NEURONTIN) 300 MG capsule  -levETIRAcetam (KEPPRA) 500 MG tablet   2) Pharmacy of Choice: [facility] [place]) Special Requests:   Approved medications will be sent to the pharmacy, we will reach out if there is an issue.  Requests made after 3pm may not be addressed until the following business day!  If a patient is unsure of the name of the medication(s) please note and ask patient to call back when they are able to provide all info, do not send to responsible party until all information is available!

## 2018-04-05 MED ORDER — LEVETIRACETAM 500 MG PO TABS: 500.0000 mg | ORAL_TABLET | Freq: Two times a day (BID) | ORAL | 0 refills | Status: DC

## 2018-04-05 MED ORDER — AMLODIPINE BESYLATE 10 MG PO TABS: 10.0000 mg | ORAL_TABLET | Freq: Every day | ORAL | 0 refills | Status: DC

## 2018-04-05 MED ORDER — GABAPENTIN 300 MG PO CAPS: 300.0000 mg | ORAL_CAPSULE | Freq: Every day | ORAL | 0 refills | Status: DC

## 2018-04-20 ENCOUNTER — Ambulatory Visit: Payer: Medicaid Other | Admitting: Internal Medicine

## 2018-04-21 ENCOUNTER — Encounter: Payer: Self-pay | Admitting: Internal Medicine

## 2018-04-21 ENCOUNTER — Ambulatory Visit: Payer: Self-pay | Attending: Internal Medicine | Admitting: Internal Medicine

## 2018-04-21 VITALS — BP 145/96 | HR 78 | Temp 98.0°F | Resp 16 | Wt 142.6 lb

## 2018-04-21 DIAGNOSIS — Z8249 Family history of ischemic heart disease and other diseases of the circulatory system: Secondary | ICD-10-CM | POA: Insufficient documentation

## 2018-04-21 DIAGNOSIS — G40909 Epilepsy, unspecified, not intractable, without status epilepticus: Secondary | ICD-10-CM

## 2018-04-21 DIAGNOSIS — I10 Essential (primary) hypertension: Secondary | ICD-10-CM

## 2018-04-21 DIAGNOSIS — F17210 Nicotine dependence, cigarettes, uncomplicated: Secondary | ICD-10-CM | POA: Insufficient documentation

## 2018-04-21 DIAGNOSIS — F17200 Nicotine dependence, unspecified, uncomplicated: Secondary | ICD-10-CM

## 2018-04-21 DIAGNOSIS — G629 Polyneuropathy, unspecified: Secondary | ICD-10-CM

## 2018-04-21 DIAGNOSIS — Z2821 Immunization not carried out because of patient refusal: Secondary | ICD-10-CM

## 2018-04-21 DIAGNOSIS — Z79899 Other long term (current) drug therapy: Secondary | ICD-10-CM | POA: Insufficient documentation

## 2018-04-21 MED ORDER — GABAPENTIN 300 MG PO CAPS: 300.0000 mg | ORAL_CAPSULE | Freq: Every day | ORAL | 6 refills | Status: DC

## 2018-04-21 MED ORDER — AMLODIPINE BESYLATE 10 MG PO TABS: 10.0000 mg | ORAL_TABLET | Freq: Every day | ORAL | 6 refills | Status: DC

## 2018-04-21 MED ORDER — LEVETIRACETAM 500 MG PO TABS: 500.0000 mg | ORAL_TABLET | Freq: Two times a day (BID) | ORAL | 6 refills | Status: DC

## 2018-04-21 NOTE — Progress Notes (Signed)
Patient ID: Darrow Egeler, male    DOB: 1976/04/15  MRN: [id number]  CC: Follow-up   Subjective: Joann Sarsour is a 43 y.o. male who presents for chronic disease management and med refills His concerns today include:  Sz disorder since 1988,subdural hematoma (05/2015), on Neurontin for weakness and numbness in RT arm and legand tobacco dependence  SZ: out Keppra since thanksgiving due to lack of funds to purchase the medication.  He is not sure whether he has the blue card with our pharmacy.  He has not had any seizures since being out of the Keppra. Does not drive  HTN: He has been out of Norvasc since Thanksgiving due to lack of funds.  Denies any chest pains or shortness of breath.  No lower extremity edema.  He tries to limit salt in the foods.  Tobacco dependence: Still smoking he states about half a pack a day.  Not ready to quit.  HM: Due for flu shot and Tdap.  He declines. Patient Active Problem List   Diagnosis Date Noted  . Elevated blood pressure reading 10/28/2017  . Neuropathy 06/30/2015  . Acute encephalopathy 06/06/2015  . Seizures (HCC) 06/03/2015  . INGUINAL HERNIA 02/06/2007  . HERNIORRHAPHY, HX OF 02/06/2007     No current outpatient medications on file prior to visit.   No current facility-administered medications on file prior to visit.     No Known Allergies  Social History   Socioeconomic History  . Marital status: Single    Spouse name: Not on file  . Number of children: Not on file  . Years of education: Not on file  . Highest education level: Not on file  Occupational History  . Not on file  Social Needs  . Financial resource strain: Not on file  . Food insecurity:    Worry: Not on file    Inability: Not on file  . Transportation needs:    Medical: Not on file    Non-medical: Not on file  Tobacco Use  . Smoking status: Current Every Day Smoker    Packs/day: 0.25  . Smokeless tobacco: Never Used  Substance and Sexual  Activity  . Alcohol use: Yes    Comment: OCC  . Drug use: No  . Sexual activity: Not on file  Lifestyle  . Physical activity:    Days per week: Not on file    Minutes per session: Not on file  . Stress: Not on file  Relationships  . Social connections:    Talks on phone: Not on file    Gets together: Not on file    Attends religious service: Not on file    Active member of club or organization: Not on file    Attends meetings of clubs or organizations: Not on file    Relationship status: Not on file  . Intimate partner violence:    Fear of current or ex partner: Not on file    Emotionally abused: Not on file    Physically abused: Not on file    Forced sexual activity: Not on file  Other Topics Concern  . Not on file  Social History Narrative  . Not on file    Family History  Problem Relation Age of Onset  . Heart failure Mother     Past Surgical History:  Procedure Laterality Date  . BRAIN SURGERY      ROS: Review of Systems Negative except as above. PHYSICAL EXAM: BP (!) 145/96   Pulse  78   Temp 98 F (36.7 C) (Oral)   Resp 16   Wt 142 lb 9.6 oz (64.7 kg)   SpO2 100%   BMI 22.33 kg/m   Physical Exam  General appearance - alert, well appearing, and in no distress Mental status - normal mood, behavior, speech, dress, motor activity, and thought processes Neck - supple, no significant adenopathy Chest - clear to auscultation, no wheezes, rales or rhonchi, symmetric air entry Heart - normal rate, regular rhythm, normal S1, S2, no murmurs, rubs, clicks or gallops Extremities - peripheral pulses normal, no pedal edema, no clubbing or cyanosis   ASSESSMENT AND PLAN: 1. Essential hypertension Not at goal.  He has been out of amlodipine for over 35-month due to lack of funds.  He states that he will have funds to get his medication in 2 days.  Refill submitted.  Advised him to apply for the blue card with the pharmacy - amLODipine (NORVASC) 10 MG tablet; Take 1  tablet (10 mg total) by mouth daily.  Dispense: 30 tablet; Refill: 6  2. Neuropathy - gabapentin (NEURONTIN) 300 MG capsule; Take 1 capsule (300 mg total) by mouth at bedtime.  Dispense: 30 capsule; Refill: 6  3. Seizure disorder (HCC) Advised to avoid running out of Keppra - levETIRAcetam (KEPPRA) 500 MG tablet; Take 1 tablet (500 mg total) by mouth 2 (two) times daily.  Dispense: 60 tablet; Refill: 6  4. Tobacco dependence Advised to quit.  Patient not ready to give a trial of quitting  5. Influenza vaccination declined  Patient was given the opportunity to ask questions.  Patient verbalized understanding of the plan and was able to repeat key elements of the plan.   No orders of the defined types were placed in this encounter.    Requested Prescriptions   Signed Prescriptions Disp Refills  . amLODipine (NORVASC) 10 MG tablet 30 tablet 6    Sig: Take 1 tablet (10 mg total) by mouth daily.  Marland Kitchen gabapentin (NEURONTIN) 300 MG capsule 30 capsule 6    Sig: Take 1 capsule (300 mg total) by mouth at bedtime.  . levETIRAcetam (KEPPRA) 500 MG tablet 60 tablet 6    Sig: Take 1 tablet (500 mg total) by mouth 2 (two) times daily.    Return in about 3 months (around 07/21/2018).  Jonah Blue, MD, FACP

## 2018-07-29 NOTE — Telephone Encounter (Signed)
error 

## 2018-10-16 ENCOUNTER — Telehealth: Payer: Self-pay | Admitting: Internal Medicine

## 2018-10-16 MED FILL — levETIRAcetam 500 MG TABS: 500 | 30 days supply | Qty: 60 | Fill #0

## 2018-10-16 MED FILL — AMLODIPINE BESYLATE 10 MG T: 10 | 30 days supply | Qty: 30 | Fill #0

## 2018-10-16 MED FILL — GABAPENTIN 300 MG CAPSULE: 300 | 30 days supply | Qty: 30 | Fill #0

## 2018-10-16 NOTE — Telephone Encounter (Signed)
Pt had refills available at the pharmacy, he never picked his medications up when they were sent on 04/21/18.

## 2018-10-16 NOTE — Telephone Encounter (Signed)
New Message   1) Medication(s) Requested (by name): amLODipine (NORVASC) 10 MG tablet, gabapentin (NEURONTIN) 300 MG capsule, and levETIRAcetam (KEPPRA) 500 MG tablet  2) Pharmacy of Choice: [facility]  3) Special Requests:   Approved medications will be sent to the pharmacy, we will reach out if there is an issue.  Requests made after 3pm may not be addressed until the following business day!  If a patient is unsure of the name of the medication(s) please note and ask patient to call back when they are able to provide all info, do not send to responsible party until all information is available!

## 2019-02-19 ENCOUNTER — Telehealth: Payer: Self-pay | Admitting: Internal Medicine

## 2019-02-19 DIAGNOSIS — G40909 Epilepsy, unspecified, not intractable, without status epilepticus: Secondary | ICD-10-CM

## 2019-02-19 DIAGNOSIS — G629 Polyneuropathy, unspecified: Secondary | ICD-10-CM

## 2019-02-19 DIAGNOSIS — I10 Essential (primary) hypertension: Secondary | ICD-10-CM

## 2019-02-19 MED ORDER — LEVETIRACETAM 500 MG PO TABS: 500.0000 mg | ORAL_TABLET | Freq: Two times a day (BID) | ORAL | 1 refills | Status: DC

## 2019-02-19 MED ORDER — GABAPENTIN 300 MG PO CAPS: 300.0000 mg | ORAL_CAPSULE | Freq: Every day | ORAL | 0 refills | Status: DC

## 2019-02-19 MED ORDER — AMLODIPINE BESYLATE 10 MG PO TABS: 10.0000 mg | ORAL_TABLET | Freq: Every day | ORAL | 0 refills | Status: DC

## 2019-02-19 MED FILL — levETIRAcetam 500 MG TABS: 500 | 30 days supply | Qty: 60 | Fill #0

## 2019-02-19 MED FILL — AMLODIPINE BESYLATE 10 MG T: 10 | 30 days supply | Qty: 30 | Fill #0

## 2019-02-19 MED FILL — GABAPENTIN 300 MG CAPSULE: 300 | 30 days supply | Qty: 30 | Fill #0

## 2019-02-19 NOTE — Telephone Encounter (Signed)
Patient has not been seen since January. No follow-ups scheduled. Will defer to Dr. Wynetta Emery.

## 2019-02-19 NOTE — Telephone Encounter (Signed)
1) Medication(s) Requested (by name): amLODipine (NORVASC) 10 MG tablet , gabapentin (NEURONTIN) 300 MG capsule, levETIRAcetam (KEPPRA) 500 MG tablet    2) Pharmacy of Choice: [facility] pharmacy   3) Special Requests:   Approved medications will be sent to the pharmacy, we will reach out if there is an issue.  Requests made after 3pm may not be addressed until the following business day!  If a patient is unsure of the name of the medication(s) please note and ask patient to call back when they are able to provide all info, do not send to responsible party until all information is available!

## 2019-02-19 NOTE — Telephone Encounter (Signed)
Lurena Joiner may you take care of this for me

## 2019-02-19 NOTE — Addendum Note (Signed)
Addended by: Karle Plumber B on: 02/19/2019 08:59 PM   Modules accepted: Orders

## 2019-02-19 NOTE — Telephone Encounter (Signed)
Patient called again to check on the status of his refills. Patient was told it take 24 hours and he stated he was out of medications. Please f/u

## 2019-02-20 NOTE — Telephone Encounter (Signed)
Rolm Gala could you please contact pt and schedule an appointment

## 2019-02-20 NOTE — Telephone Encounter (Signed)
Pt is aware and scheduled appt

## 2019-03-12 ENCOUNTER — Ambulatory Visit: Payer: Medicaid Other | Admitting: Internal Medicine

## 2019-03-29 ENCOUNTER — Other Ambulatory Visit: Payer: Self-pay

## 2019-03-29 ENCOUNTER — Encounter: Payer: Self-pay | Admitting: Internal Medicine

## 2019-03-29 ENCOUNTER — Ambulatory Visit: Payer: Self-pay | Attending: Internal Medicine | Admitting: Internal Medicine

## 2019-03-29 DIAGNOSIS — G6289 Other specified polyneuropathies: Secondary | ICD-10-CM

## 2019-03-29 DIAGNOSIS — F17210 Nicotine dependence, cigarettes, uncomplicated: Secondary | ICD-10-CM

## 2019-03-29 DIAGNOSIS — I10 Essential (primary) hypertension: Secondary | ICD-10-CM

## 2019-03-29 DIAGNOSIS — Z2821 Immunization not carried out because of patient refusal: Secondary | ICD-10-CM

## 2019-03-29 DIAGNOSIS — G629 Polyneuropathy, unspecified: Secondary | ICD-10-CM

## 2019-03-29 DIAGNOSIS — F17200 Nicotine dependence, unspecified, uncomplicated: Secondary | ICD-10-CM

## 2019-03-29 DIAGNOSIS — G40909 Epilepsy, unspecified, not intractable, without status epilepticus: Secondary | ICD-10-CM

## 2019-03-29 MED ORDER — AMLODIPINE BESYLATE 10 MG PO TABS: 10.0000 mg | ORAL_TABLET | Freq: Every day | ORAL | 6 refills | Status: DC

## 2019-03-29 MED ORDER — LEVETIRACETAM 500 MG PO TABS: 500.0000 mg | ORAL_TABLET | Freq: Two times a day (BID) | ORAL | 6 refills | Status: DC

## 2019-03-29 MED ORDER — GABAPENTIN 300 MG PO CAPS: 300.0000 mg | ORAL_CAPSULE | Freq: Every day | ORAL | 6 refills | Status: DC

## 2019-03-29 MED FILL — GABAPENTIN 300 MG CAPSULE: 300 | 30 days supply | Qty: 30 | Fill #0

## 2019-03-29 MED FILL — AMLODIPINE BESYLATE 10 MG T: 10 | 30 days supply | Qty: 30 | Fill #0

## 2019-03-29 MED FILL — levETIRAcetam 500 MG TABS: 500 | 30 days supply | Qty: 60 | Fill #0

## 2019-03-29 NOTE — Progress Notes (Signed)
Virtual Visit via Telephone Note Due to current restrictions/limitations of in-office visits due to the COVID-19 pandemic, this scheduled clinical appointment was converted to a telehealth visit  I connected with Michael Oneill on 03/29/19 at 1:44 p.m by telephone and verified that I am speaking with the correct person using two identifiers. I am in my office.  The patient is at home.  Only the patient and myself participated in this encounter.  I discussed the limitations, risks, security and privacy concerns of performing an evaluation and management service by telephone and the availability of in person appointments. I also discussed with the patient that there may be a patient responsible charge related to this service. The patient expressed understanding and agreed to proceed.   History of Present Illness: Sz disorder since 1988,subdural hematoma (05/2015), on Neurontin for weakness and numbness in RT arm and legand tobaccodependence.  Last seen 04/2018.  Purpose of today's visit is chronic disease management.  Patient tells me that he was trying to get none appointment earlier this year but was not able to get in.  SZ:  No sz since last visit. Reports compliance with Keppra.  No HA.  Some dizziness about 1 mth ago. No falls.  On last chemistry he had an elevation in one of his liver enzymes.  Screen for hepatitis C was negative.  HYPERTENSION Currently taking: see medication list Med Adherence: [x]  Yes    []  No Medication side effects: []  Yes    [x]  No Adherence with salt restriction: [x]  Yes    []  No Home Monitoring?: []  Yes    [x]  No, no device to check blood pressure Monitoring Frequency: []  Yes    []  No Home BP results range: []  Yes    []  No SOB? []  Yes    [x]  No Chest Pain?: []  Yes    [x]  No Leg swelling?: []  Yes    [x]  No Headaches?: []  Yes    [x]  No Dizziness? []  Yes    [x]  Not currently Comments:    Tobacco Dependence: smoking 5 cigarettes a day.  Not ready to  quit.  HM: declines flu vaccine Outpatient Encounter Medications as of 03/29/2019  Medication Sig  . amLODipine (NORVASC) 10 MG tablet Take 1 tablet (10 mg total) by mouth daily.  Marland Kitchen gabapentin (NEURONTIN) 300 MG capsule Take 1 capsule (300 mg total) by mouth at bedtime.  . levETIRAcetam (KEPPRA) 500 MG tablet Take 1 tablet (500 mg total) by mouth 2 (two) times daily.   No facility-administered encounter medications on file as of 03/29/2019.      Observations/Objective: No direct observation done as this was a telephone encounter.  Assessment and Plan: 1. Essential hypertension Level of control unknown.  I have requested my CMA give him an appointment with the clinical pharmacist next week for blood pressure check.  If blood pressure is not at goal, we can add low-dose HCTZ. - amLODipine (NORVASC) 10 MG tablet; Take 1 tablet (10 mg total) by mouth daily.  Dispense: 30 tablet; Refill: 6 - CBC; Future - Comprehensive metabolic panel; Future - Lipid panel; Future  2. Neuropathy - gabapentin (NEURONTIN) 300 MG capsule; Take 1 capsule (300 mg total) by mouth at bedtime.  Dispense: 30 capsule; Refill: 6  3. Seizure disorder ([place]) Stable with no seizures this past year. - levETIRAcetam (KEPPRA) 500 MG tablet; Take 1 tablet (500 mg total) by mouth 2 (two) times daily.  Dispense: 60 tablet; Refill: 6  4. Tobacco dependence Patient advised to  quit smoking. Discussed health risks associated with smoking including lung and other types of cancers, chronic lung diseases and CV risks.. Pt not ready to give trail of quitting.  Less than 5 minutes spent on counseling.  5. Influenza vaccination declined This was offered.  Patient declined.   Follow Up Instructions: 4 months   I discussed the assessment and treatment plan with the patient. The patient was provided an opportunity to ask questions and all were answered. The patient agreed with the plan and demonstrated an understanding of the  instructions.   The patient was advised to call back or seek an in-person evaluation if the symptoms worsen or if the condition fails to improve as anticipated.  I provided 8 minutes of non-face-to-face time during this encounter.   Jonah Blue, MD

## 2019-04-05 ENCOUNTER — Ambulatory Visit: Payer: Medicaid Other | Admitting: Pharmacist

## 2019-04-10 MED FILL — levETIRAcetam 500 MG TABS: 500 | 30 days supply | Qty: 60 | Fill #0

## 2019-04-10 MED FILL — GABAPENTIN 300 MG CAPSULE: 300 | 30 days supply | Qty: 30 | Fill #0

## 2019-04-10 MED FILL — AMLODIPINE BESYLATE 10 MG T: 10 | 30 days supply | Qty: 30 | Fill #0

## 2019-04-27 ENCOUNTER — Ambulatory Visit: Payer: Medicaid Other | Admitting: Pharmacist

## 2019-07-26 MED FILL — levETIRAcetam 500 MG TABS: 500 | 30 days supply | Qty: 60 | Fill #1

## 2019-07-26 MED FILL — AMLODIPINE BESYLATE 10 MG T: 10 | 30 days supply | Qty: 30 | Fill #1

## 2019-07-26 MED FILL — GABAPENTIN 300 MG CAPSULE: 300 | 30 days supply | Qty: 30 | Fill #1

## 2019-07-30 ENCOUNTER — Ambulatory Visit: Payer: Self-pay | Attending: Internal Medicine | Admitting: Internal Medicine

## 2019-07-30 ENCOUNTER — Encounter: Payer: Self-pay | Admitting: Internal Medicine

## 2019-07-30 ENCOUNTER — Other Ambulatory Visit: Payer: Self-pay

## 2019-07-30 DIAGNOSIS — I10 Essential (primary) hypertension: Secondary | ICD-10-CM

## 2019-07-30 DIAGNOSIS — G40909 Epilepsy, unspecified, not intractable, without status epilepticus: Secondary | ICD-10-CM

## 2019-07-30 NOTE — Progress Notes (Signed)
Virtual Visit via Telephone Note Due to current restrictions/limitations of in-office visits due to the COVID-19 pandemic, this scheduled clinical appointment was converted to a telehealth visit  I connected with Michael Oneill on 07/30/19 at 4:32 p.m by telephone and verified that I am speaking with the correct person using two identifiers. I am in my office.  The patient is at home.  Only the patient and myself participated in this encounter.  I discussed the limitations, risks, security and privacy concerns of performing an evaluation and management service by telephone and the availability of in person appointments. I also discussed with the patient that there may be a patient responsible charge related to this service. The patient expressed understanding and agreed to proceed.   History of Present Illness: Sz disordersince 1988,subdural hematoma(05/2015), on Neurontin for weakness and numbness in RT arm and legand tobaccodependence.   No major issues since last visit. He is currently living in [place].  He tells me he now has Medicare and Medicaid.  He is thinking about maintaining his care with [place] stating that it is only a 5-hour drive to [place] from where he is in [place].  HYPERTENSION Currently taking: see medication list Med Adherence: [x]  Yes.  He never did come to the lab as instructed on last visit to have his baseline blood test done.   []  No Medication side effects: []  Yes    [x]  No Adherence with salt restriction: [x]  Yes    []  No Home Monitoring?: []  Yes    [x]  No Monitoring Frequency: []  Yes    []  No Home BP results range: []  Yes    []  No SOB? []  Yes    [x]  No Chest Pain?: []  Yes    [x]  No Leg swelling?: []  Yes    [x]  No Headaches?: []  Yes    [x]  No Dizziness? []  Yes    [x]  No Comments:    SZ: no sz since we last spoke.  Outpatient Encounter Medications as of 07/30/2019  Medication Sig  . amLODipine (NORVASC) 10 MG tablet Take 1 tablet (10 mg  total) by mouth daily.  gabapentin (NEURONTIN) 300 MG capsule Take 1 capsule (300 mg total) by mouth at bedtime.  . levETIRAcetam (KEPPRA) 500 MG tablet Take 1 tablet (500 mg total) by mouth 2 (two) times daily.   No facility-administered encounter medications on file as of 07/30/2019.      Observations/Objective: No direct observation  Assessment and Plan: 1. Essential hypertension Advised to continue amlodipine.  Advised patient to establish care with a primary care doctor there in [place] .  There is no need for him to drive 5 hours to be seen here if he now has Medicare and Medicaid.  2. Seizure disorder (HCC) Continue Keppra.  Again I have encouraged him to establish care in [place] where he now resides   Follow Up Instructions:    I discussed the assessment and treatment plan with the patient. The patient was provided an opportunity to ask questions and all were answered. The patient agreed with the plan and demonstrated an understanding of the instructions.   The patient was advised to call back or seek an in-person evaluation if the symptoms worsen or if the condition fails to improve as anticipated.  I provided 10 minutes of non-face-to-face time during this encounter.   , MD

## 2019-10-15 MED FILL — AMLODIPINE BESYLATE 10 MG T: 10 | 30 days supply | Qty: 30 | Fill #2

## 2019-10-15 MED FILL — levETIRAcetam 500 MG TABS: 500 | 30 days supply | Qty: 60 | Fill #2

## 2019-10-15 MED FILL — GABAPENTIN 300 MG CAPSULE: 300 | 30 days supply | Qty: 30 | Fill #2

## 2019-10-29 MED FILL — GABAPENTIN 300 MG CAPSULE: 300 | 30 days supply | Qty: 30 | Fill #2

## 2019-10-29 MED FILL — levETIRAcetam 500 MG TABS: 500 | 30 days supply | Qty: 60 | Fill #2

## 2019-10-29 MED FILL — AMLODIPINE BESYLATE 10 MG T: 10 | 30 days supply | Qty: 30 | Fill #2

## 2020-02-22 ENCOUNTER — Ambulatory Visit: Payer: Medicaid Other | Admitting: Internal Medicine

## 2020-02-22 MED FILL — levETIRAcetam 500 MG TABS: 500 | 30 days supply | Qty: 60 | Fill #3

## 2020-02-22 MED FILL — GABAPENTIN 300 MG CAPSULE: 300 | 30 days supply | Qty: 30 | Fill #3

## 2020-02-22 MED FILL — AMLODIPINE BESYLATE 10 MG T: 10 | 30 days supply | Qty: 30 | Fill #3

## 2020-03-21 MED FILL — levETIRAcetam 500 MG TABS: 500 | 30 days supply | Qty: 60 | Fill #3

## 2020-03-21 MED FILL — AMLODIPINE BESYLATE 10 MG T: 10 | 30 days supply | Qty: 30 | Fill #3

## 2020-03-21 MED FILL — GABAPENTIN 300 MG CAPSULE: 300 | 30 days supply | Qty: 30 | Fill #3

## 2020-04-28 ENCOUNTER — Other Ambulatory Visit: Payer: Self-pay | Admitting: Internal Medicine

## 2020-04-28 ENCOUNTER — Encounter: Payer: Self-pay | Admitting: Internal Medicine

## 2020-04-28 ENCOUNTER — Ambulatory Visit: Payer: Self-pay | Attending: Internal Medicine | Admitting: Internal Medicine

## 2020-04-28 ENCOUNTER — Other Ambulatory Visit: Payer: Self-pay

## 2020-04-28 VITALS — BP 151/94 | HR 72 | Temp 98.5°F | Resp 16 | Ht 68.0 in | Wt 148.8 lb

## 2020-04-28 DIAGNOSIS — G629 Polyneuropathy, unspecified: Secondary | ICD-10-CM

## 2020-04-28 DIAGNOSIS — G40909 Epilepsy, unspecified, not intractable, without status epilepticus: Secondary | ICD-10-CM

## 2020-04-28 DIAGNOSIS — Z2821 Immunization not carried out because of patient refusal: Secondary | ICD-10-CM

## 2020-04-28 DIAGNOSIS — F320 Major depressive disorder, single episode, mild: Secondary | ICD-10-CM

## 2020-04-28 DIAGNOSIS — I10 Essential (primary) hypertension: Secondary | ICD-10-CM

## 2020-04-28 DIAGNOSIS — F17200 Nicotine dependence, unspecified, uncomplicated: Secondary | ICD-10-CM

## 2020-04-28 MED ORDER — AMLODIPINE BESYLATE 10 MG PO TABS: 10.0000 mg | ORAL_TABLET | Freq: Every day | ORAL | 6 refills | Status: DC

## 2020-04-28 MED ORDER — SERTRALINE HCL 50 MG PO TABS: ORAL_TABLET | ORAL | 1 refills | Status: DC

## 2020-04-28 MED ORDER — GABAPENTIN 300 MG PO CAPS: 300.0000 mg | ORAL_CAPSULE | Freq: Every day | ORAL | 6 refills | Status: DC

## 2020-04-28 MED ORDER — LEVETIRACETAM 500 MG PO TABS: 500.0000 mg | ORAL_TABLET | Freq: Two times a day (BID) | ORAL | 6 refills | Status: DC

## 2020-04-28 NOTE — Progress Notes (Signed)
Patient ID: Michael Oneill, male    DOB: [birth year]  MRN: [id number]  CC: Hypertension   Subjective: Michael Oneill is a 45 y.o. male who presents for chronic disease management.  Last visit was a telephone visit in April of last year. His concerns today include:  HTN, tob dep, Sz disordersince 1988,subdural hematoma(05/2015), on Neurontin for weakness and numbness in RT arm and legand tobaccodependence.  HTN:  Reports compliance with Norvasc even though last prescription was written back in 2020 with only 6 refills.  No device to check BP Tries to limit salt in the foods.  Denies any chest pains or shortness of breath.  No lower extremity swelling.  SZ: no sz since last.  Compliant with taking Keppra.  Requests refill on gabapentin and Keppra.  Tob Dep: 1/2 pk a day. Not ready to give a trial of quitting.  Denies any street drug use.  Pos Dep screen: feels dep sometimes.  Worry a lot.  "I feel like I have failed a lot of people."  Patient does not care to elaborate much.  He denies suicidal ideation.  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   Patient Active Problem List   Diagnosis Date Noted  . Influenza vaccine refused 04/28/2020  . Essential hypertension 04/21/2018  . Tobacco dependence 04/21/2018  . Neuropathy 06/30/2015  . Seizures (HCC) 06/03/2015  . INGUINAL HERNIA 02/06/2007  . HERNIORRHAPHY, HX OF 02/06/2007     No current outpatient medications on file prior to visit.   No current facility-administered medications on file prior to visit.    No Known Allergies  Social History   Socioeconomic History  . Marital status: Single    Spouse name: Not on file  . Number of children: Not on file  . Years of education: Not on file  . Highest education level: Not on file  Occupational History  . Not on file  Tobacco Use  . Smoking status: Current Every Day Smoker    Packs/day: 0.25  . Smokeless tobacco: Never Used  Vaping Use   . Vaping Use: Never used  Substance and Sexual Activity  . Alcohol use: Yes    Comment: OCC  . Drug use: No  . Sexual activity: Not on file  Other Topics Concern  . Not on file  Social History Narrative  . Not on file   Social Determinants of Health   Financial Resource Strain: Not on file  Food Insecurity: Not on file  Transportation Needs: Not on file  Physical Activity: Not on file  Stress: Not on file  Social Connections: Not on file  Intimate Partner Violence: Not on file    Family History  Problem Relation Age of Onset  . Heart failure Mother     Past Surgical History:  Procedure Laterality Date  . BRAIN SURGERY      ROS: Review of Systems Negative except as stated above  PHYSICAL EXAM: BP (!) 151/94   Pulse 72   Temp 98.5 F (36.9 C)   Resp 16   Ht 5\' 8"  (1.727 m)   Wt 148 lb 12.8 oz (67.5 kg)   SpO2 99%   BMI 22.62 kg/m   Wt Readings from Last 3 Encounters:  04/28/20 148 lb 12.8 oz (67.5 kg)  04/21/18 142 lb 9.6 oz (64.7 kg)  10/28/17 145 lb 9.6 oz (66 kg)    Physical Exam  General appearance - alert, well appearing, middle-aged African-American male and in  no distress Mental status -patient with very flat affect and poor eye contact.  He is having a very difficult time opening up to me about his feelings of depression.   Mouth - mucous membranes moist, pharynx normal without lesions Neck - supple, no significant adenopathy Chest - clear to auscultation, no wheezes, rales or rhonchi, symmetric air entry Heart - normal rate, regular rhythm, normal S1, S2, no murmurs, rubs, clicks or gallops Extremities - peripheral pulses normal, no pedal edema, no clubbing or cyanosis  Depression screen [facility] 2/9 04/28/2020 04/21/2018 10/28/2017  Decreased Interest 2 2 3   Down, Depressed, Hopeless 1 1 0  PHQ - 2 Score 3 3 3   Altered sleeping 1 3 3   Tired, decreased energy 2 3 3   Change in appetite 3 3 (No Data)  Feeling bad or failure about yourself  2 1 2    Trouble concentrating 2 2 2   Moving slowly or fidgety/restless 0 1 0  Suicidal thoughts 0 0 0  PHQ-9 Score 13 16 13     CMP Latest Ref Rng & Units 09/08/2017 06/23/2017 11/16/2016  Glucose 65 - 99 mg/dL - 73 88  BUN 6 - 24 mg/dL - 10 11  Creatinine - 1.27 mg/dL -   Sodium - 144 mmol/L - 141 139  Potassium 3.5 - 5.2 mmol/L - 4.0 4.3  Chloride 96 - 106 mmol/L - 101 105  CO2 20 - 29 mmol/L - 23 -  Calcium 8.7 - 10.2 mg/dL - 9.0 -  Total Protein 6.0 - 8.5 g/dL 7.2 7.3 -  Total Bilirubin 0.0 - 1.2 mg/dL 09/10/2017 0.3 -  Alkaline Phos 39 - 117 IU/L 109 80 -  AST 0 - 40 IU/L 100(H) 90(H) -  ALT 0 - 44 IU/L 29 26 -   Lipid Panel     Component Value Date/Time   TRIG 136 06/03/2015 1710    CBC    Component Value Date/Time   WBC 4.4 09/08/2017 1555   WBC 5.5 11/01/2016 1842   RBC 4.07 (L) 09/08/2017 1555   RBC 4.01 (L) 11/01/2016 1842   HGB 13.7 09/08/2017 1555   HCT 39.2 09/08/2017 1555   PLT 191 09/08/2017 1555   MCV 96 09/08/2017 1555   MCH 33.7 (H) 09/08/2017 1555   MCH 33.2 11/01/2016 1842   MCHC 34.9 09/08/2017 1555   MCHC 35.8 11/01/2016 1842   RDW 15.2 09/08/2017 1555   LYMPHSABS 5.7 (H) 06/03/2015 1710   MONOABS 1.9 (H) 06/03/2015 1710   EOSABS 0.1 06/03/2015 1710   BASOSABS 0.1 06/03/2015 1710    ASSESSMENT AND PLAN: 1. Essential hypertension Not at goal.  I spoke about adding another blood pressure medication.  Patient then admitted that he has not been taking the amlodipine every day.  He was stretching out the medication to make it last due to limited finances.  Advised patient that he can apply for the blue card with the pharmacy and also speak with the pharmacy about getting a credit when he is unable to afford the medication.  We will leave him on amlodipine 10 mg for now. - CBC - Comprehensive metabolic panel - Lipid panel - amLODipine (NORVASC) 10 MG tablet; Take 1 tablet (10 mg total) by mouth daily.  Dispense: 30 tablet; Refill: 6  2.  Tobacco dependence Advised to quit.  Patient not ready to give a trial of quitting.  Less than 5 minutes spent on counseling.  3. Seizure disorder (HCC) Controlled on Keppra. - levETIRAcetam (  KEPPRA) 500 MG tablet; Take 1 tablet (500 mg total) by mouth 2 (two) times daily.  Dispense: 60 tablet; Refill: 6  4. Mild major depression, single episode [facility]) Discussed management of depression with counseling and medication.  Willing to try both.  We will have them schedule an appointment with him to see [facility] CSW for counseling.  He is agreeable to starting Zoloft.  I went over with him how to take the medication.  Should he develop any increasing depression or suicidal ideation while on the medication he should stop it and give me a call. - sertraline (ZOLOFT) 50 MG tablet; Take half tablet p.o. daily x4 weeks then 1 tablet daily.  Dispense: 30 tablet; Refill: 1  5. Neuropathy - gabapentin (NEURONTIN) 300 MG capsule; Take 1 capsule (300 mg total) by mouth at bedtime.  Dispense: 30 capsule; Refill: 6  6. Influenza vaccine refused Advised.  Patient declined.  7. COVID-19 vaccination declined Advised.  Patient declined.  8. Tetanus, diphtheria, and acellular pertussis (Tdap) vaccination declined Advised.  Patient declined.     Patient was given the opportunity to ask questions.  Patient verbalized understanding of the plan and was able to repeat key elements of the plan.   Orders Placed This Encounter  Procedures  . CBC  . Comprehensive metabolic panel  . Lipid panel     Requested Prescriptions   Signed Prescriptions Disp Refills  . levETIRAcetam (KEPPRA) 500 MG tablet 60 tablet 6    Sig: Take 1 tablet (500 mg total) by mouth 2 (two) times daily.  Marland Kitchen amLODipine (NORVASC) 10 MG tablet 30 tablet 6    Sig: Take 1 tablet (10 mg total) by mouth daily.  Marland Kitchen gabapentin (NEURONTIN) 300 MG capsule 30 capsule 6    Sig: Take 1 capsule (300 mg total) by mouth at bedtime.  . sertraline (ZOLOFT)  50 MG tablet 30 tablet 1    Sig: Take half tablet p.o. daily x4 weeks then 1 tablet daily.    Return in about 6 weeks (around 06/09/2020) for Give appt with [facility] for depression.  Jonah Blue, MD, FACP

## 2020-04-28 NOTE — Patient Instructions (Signed)
Please take your blood pressure medicine amlodipine every day as prescribed. We have started you on the medication called Zoloft (Sertraline) to help decrease the depression.  If you experience any worsening depression or suicidal thoughts while on the medication please stop the medication and give [place] a call immediately.   Sertraline Solution What is this medicine? SERTRALINE (SER tra leen) is used to treat depression. It may also be used to treat obsessive compulsive disorder, panic disorder, post-trauma stress disorder, premenstrual dysphoric disorder (PMDD) or social anxiety. This medicine may be used for other purposes; ask your health care provider or pharmacist if you have questions. COMMON BRAND NAME(S): Zoloft What should I tell my health care provider before I take this medicine? They need to know if you have any of these conditions:  bleeding disorders  bipolar disorder or a family history of bipolar disorder  glaucoma  heart disease  high blood pressure  history of irregular heartbeat  history of low levels of calcium, magnesium, or potassium in the blood  if you often drink alcohol  liver disease  receiving electroconvulsive therapy  seizures  suicidal thoughts, plans, or attempt; a previous suicide attempt by you or a family member  take medicines that treat or prevent blood clots  thyroid disease  an unusual or allergic reaction to sertraline, other medicines, foods, dyes, or preservatives  pregnant or trying to get pregnant  breast-feeding How should I use this medicine? Take this medicine by mouth. Follow the directions on the prescription label. Before taking your dose, you need to dilute the solution in a beverage. Measure your medicine dose using the dropper in the bottle. Next, place the measured dose in at least 4 ounces (one-half cup) of water, ginger-ale, lemon-lime soda, lemonade or orange juice and mix. Do not mix in grapefruit juice or in any  other liquids other than those listed. Drink all of mixed liquid immediately. Do not mix the dose and store it for later. It must be taken right away. You may take this medicine with or without food. Take your medicine at regular intervals. Do not take your medicine more often than directed. Do not stop taking this medicine suddenly except upon the advice of your doctor. Stopping this medicine too quickly may cause serious side effects or your condition may worsen. A special MedGuide will be given to you by the pharmacist with each prescription and refill. Be sure to read this information carefully each time. Talk to your pediatrician regarding the use of this medicine in children. While this drug may be prescribed for children as young as 7 years for selected conditions, precautions do apply. Overdosage: If you think you have taken too much of this medicine contact a poison control center or emergency room at once. NOTE: This medicine is only for you. Do not share this medicine with others. What if I miss a dose? If you miss a dose, take it as soon as you can. If it is almost time for your next dose, take only that dose. Do not take double or extra doses. What may interact with this medicine? Do not take this medicine with any of the following medications:  cisapride  dronedarone  linezolid  MAOIs like Carbex, Eldepryl, Marplan, Nardil, and Parnate  methylene blue (injected into a vein)  pimozide  thioridazine This medicine may also interact with the following medications:  alcohol  amphetamines  aspirin and aspirin-like medicines  certain medicines for depression, anxiety, or psychotic disturbances  certain medicines  for fungal infections like ketoconazole, fluconazole, posaconazole, and itraconazole  certain medicines for irregular heart beat like flecainide, quinidine, propafenone  certain medicines for migraine headaches like almotriptan, eletriptan, frovatriptan,  naratriptan, rizatriptan, sumatriptan, zolmitriptan  certain medicines for sleep  certain medicines for seizures like carbamazepine, valproic acid, phenytoin  certain medicines that treat or prevent blood clots like warfarin, enoxaparin, dalteparin  cimetidine  digoxin  diuretics  fentanyl  isoniazid  lithium  NSAIDs, medicines for pain and inflammation, like ibuprofen or naproxen  other medicines that prolong the QT interval (cause an abnormal heart rhythm) like dofetilide  rasagiline  safinamide  supplements like St. John's wort, kava kava, valerian  tolbutamide  tramadol  tryptophan This list may not describe all possible interactions. Give your health care provider a list of all the medicines, herbs, non-prescription drugs, or dietary supplements you use. Also tell them if you smoke, drink alcohol, or use illegal drugs. Some items may interact with your medicine. What should I watch for while using this medicine? Tell your doctor if your symptoms do not get better or if they get worse. Visit your doctor or health care professional for regular checks on your progress. Because it may take several weeks to see the full effects of this medicine, it is important to continue your treatment as prescribed by your doctor. Patients and their families should watch out for new or worsening thoughts of suicide or depression. Also watch out for sudden changes in feelings such as feeling anxious, agitated, panicky, irritable, hostile, aggressive, impulsive, severely restless, overly excited and hyperactive, or not being able to sleep. If this happens, especially at the beginning of treatment or after a change in dose, call your health care professional. Bonita Quin may get drowsy or dizzy. Do not drive, use machinery, or do anything that needs mental alertness until you know how this medicine affects you. Do not stand or sit up quickly, especially if you are an older patient. This reduces the  risk of dizzy or fainting spells. Alcohol may interfere with the effect of this medicine. Avoid alcoholic drinks. This medicine contains a high percentage of alcohol that may interact with medicines used to treat alcohol abuse, like Antabuse (disulfiram). You should not take these medicines together. Your mouth may get dry. Chewing sugarless gum or sucking hard candy, and drinking plenty of water may help. Contact your doctor if the problem does not go away or is severe. Some products may contain alcohol. Ask your pharmacist or healthcare provider if this medicine contains alcohol. Be sure to tell all healthcare providers you are taking this medicine. Certain medicines, like metronidazole and disulfiram, can cause an unpleasant reaction when taken with alcohol. The reaction includes flushing, headache, nausea, vomiting, sweating, and increased thirst. The reaction can last from 30 minutes to several hours. What side effects may I notice from receiving this medicine? Side effects that you should report to your doctor or health care professional as soon as possible:  allergic reactions like skin rash, itching or hives, swelling of the face, lips, or tongue  anxious  black, tarry stools  changes in vision  confusion  elevated mood, decreased need for sleep, racing thoughts, impulsive behavior  eye pain  fast, irregular heartbeat  feeling faint or lightheaded, falls  feeling agitated, angry, or irritable  hallucination, loss of contact with reality  loss of balance or coordination  loss of memory  painful or prolonged erections  restlessness, pacing, inability to keep still  seizures  stiff muscles  suicidal thoughts or other mood changes  trouble sleeping  unusual bleeding or bruising  unusually weak or tired  vomiting Side effects that usually do not require medical attention (report to your doctor or health care professional if they continue or are  bothersome):  change in appetite or weight  change in sex drive or performance  diarrhea  increased sweating  indigestion, nausea  tremors This list may not describe all possible side effects. Call your doctor for medical advice about side effects. You may report side effects to FDA at 1-800-FDA-1088. Where should I keep my medicine? Keep out of the reach of children. Store at room temperature between 15 and 30 degrees C (59 and 86 degrees F). Throw away any unused medicine after the expiration date. NOTE: This sheet is a summary. It may not cover all possible information. If you have questions about this medicine, talk to your doctor, pharmacist, or health care provider.  2021 Elsevier/Gold Standard (2020-02-13 19:04:00)

## 2020-04-29 LAB — COMPREHENSIVE METABOLIC PANEL
ALT: 17 IU/L (ref 0–44)
AST: 65 IU/L — ABNORMAL HIGH (ref 0–40)
Albumin/Globulin Ratio: 1.9 (ref 1.2–2.2)
Albumin: 5 g/dL (ref 4.0–5.0)
Alkaline Phosphatase: 100 IU/L (ref 44–121)
BUN/Creatinine Ratio: 10 (ref 9–20)
BUN: 8 mg/dL (ref 6–24)
Bilirubin Total: 0.2 mg/dL (ref 0.0–1.2)
CO2: 23 mmol/L (ref 20–29)
Calcium: 9.4 mg/dL (ref 8.7–10.2)
Chloride: 102 mmol/L (ref 96–106)
Creatinine, Ser: 0.84 mg/dL (ref 0.76–1.27)
GFR calc Af Amer: 121 mL/min/{1.73_m2} (ref >59)
GFR calc non Af Amer: 105 mL/min/{1.73_m2} (ref >59)
Globulin, Total: 2.6 g/dL (ref 1.5–4.5)
Glucose: 79 mg/dL (ref 65–99)
Potassium: 4 mmol/L (ref 3.5–5.2)
Sodium: 140 mmol/L (ref 134–144)
Total Protein: 7.6 g/dL (ref 6.0–8.5)

## 2020-04-29 LAB — CBC
Hematocrit: 38.1 % (ref 37.5–51.0)
Hemoglobin: 13.7 g/dL (ref 13.0–17.7)
MCH: 33.8 pg — ABNORMAL HIGH (ref 26.6–33.0)
MCHC: 36 g/dL — ABNORMAL HIGH (ref 31.5–35.7)
MCV: 94 fL (ref 79–97)
Platelets: 240 10*3/uL (ref 150–450)
RBC: 4.05 x10E6/uL — ABNORMAL LOW (ref 4.14–5.80)
RDW: 14 % (ref 11.6–15.4)
WBC: 5.8 10*3/uL (ref 3.4–10.8)

## 2020-04-29 LAB — LIPID PANEL
Chol/HDL Ratio: 1.9 ratio (ref 0.0–5.0)
Cholesterol, Total: 302 mg/dL — ABNORMAL HIGH (ref 100–199)
HDL: 156 mg/dL (ref >39)
LDL Chol Calc (NIH): 127 mg/dL — ABNORMAL HIGH (ref 0–99)
Triglycerides: 118 mg/dL (ref 0–149)
VLDL Cholesterol Cal: 19 mg/dL (ref 5–40)

## 2020-04-29 MED FILL — GABAPENTIN 300 MG CAPSULE: 300 | 30 days supply | Qty: 30 | Fill #0

## 2020-04-29 MED FILL — SERTRALINE HCL 50 MG TABLET: 50 | 28 days supply | Qty: 14 | Fill #0

## 2020-04-29 MED FILL — levETIRAcetam 500 MG TABS: 500 | 30 days supply | Qty: 60 | Fill #0

## 2020-04-29 MED FILL — AMLODIPINE BESYLATE 10 MG T: 10 | 30 days supply | Qty: 30 | Fill #0

## 2020-04-30 ENCOUNTER — Ambulatory Visit: Payer: Self-pay | Admitting: *Deleted

## 2020-04-30 ENCOUNTER — Telehealth: Payer: Self-pay

## 2020-04-30 ENCOUNTER — Encounter: Payer: Self-pay | Admitting: Internal Medicine

## 2020-04-30 ENCOUNTER — Other Ambulatory Visit: Payer: Self-pay | Admitting: Internal Medicine

## 2020-04-30 DIAGNOSIS — E782 Mixed hyperlipidemia: Secondary | ICD-10-CM | POA: Insufficient documentation

## 2020-04-30 DIAGNOSIS — R945 Abnormal results of liver function studies: Secondary | ICD-10-CM

## 2020-04-30 DIAGNOSIS — R7989 Other specified abnormal findings of blood chemistry: Secondary | ICD-10-CM

## 2020-04-30 NOTE — Progress Notes (Signed)
Let patient know that his blood cell counts including white blood cell, red blood cell and platelet counts are normal.  Kidney function normal.  He has mild elevation in one of his liver function tests but it has improved compared to 2 years ago.  Avoid drinking alcohol in excess.  He should return to the lab at his convenience for [place] to do additional blood test to screen for viral hepatitis. Total cholesterol is 302 with goal being less than 200.  LDL cholesterol is 127 with goal being less than 100.  High cholesterol increases the risk for heart attack and strokes.  Healthy eating habits and trying to get in some form of moderate intensity exercise like brisk walking 5 days a week will help to lower cholesterol.

## 2020-04-30 NOTE — Telephone Encounter (Signed)
Contacted pt to go over lab results pt didn't answer lvm  

## 2020-04-30 NOTE — Telephone Encounter (Signed)
Unable to review lab results due to poor connection. Will attempt to contact patient again.

## 2020-05-14 ENCOUNTER — Institutional Professional Consult (permissible substitution): Payer: Medicaid Other | Admitting: Licensed Clinical Social Worker

## 2020-06-06 MED FILL — levETIRAcetam 500 MG TABS: 500 | 30 days supply | Qty: 60 | Fill #1

## 2020-06-06 MED FILL — AMLODIPINE BESYLATE 10 MG T: 10 | 30 days supply | Qty: 30 | Fill #1

## 2020-06-06 MED FILL — SERTRALINE HCL 50 MG TABLET: 50 | 28 days supply | Qty: 14 | Fill #1

## 2020-06-06 MED FILL — GABAPENTIN 300 MG CAPSULE: 300 | 30 days supply | Qty: 30 | Fill #1

## 2020-06-12 ENCOUNTER — Ambulatory Visit: Payer: Self-pay | Attending: Internal Medicine | Admitting: Internal Medicine

## 2020-06-12 ENCOUNTER — Other Ambulatory Visit: Payer: Self-pay

## 2020-06-12 DIAGNOSIS — I10 Essential (primary) hypertension: Secondary | ICD-10-CM

## 2020-06-12 DIAGNOSIS — F320 Major depressive disorder, single episode, mild: Secondary | ICD-10-CM

## 2020-06-12 DIAGNOSIS — R945 Abnormal results of liver function studies: Secondary | ICD-10-CM

## 2020-06-12 DIAGNOSIS — Z114 Encounter for screening for human immunodeficiency virus [HIV]: Secondary | ICD-10-CM

## 2020-06-12 DIAGNOSIS — R7989 Other specified abnormal findings of blood chemistry: Secondary | ICD-10-CM

## 2020-06-12 DIAGNOSIS — E782 Mixed hyperlipidemia: Secondary | ICD-10-CM

## 2020-06-12 NOTE — Progress Notes (Signed)
Virtual Visit via Telephone Note  I connected with Michael Oneill on 06/12/20 at 3:29 p.m by telephone and verified that I am speaking with the correct person using two identifiers.  Location: Patient: home Provider: office  The patient, my CMA Ms. Donata Duff and myself participated in this visit. I discussed the limitations, risks, security and privacy concerns of performing an evaluation and management service by telephone and the availability of in person appointments. I also discussed with the patient that there may be a patient responsible charge related to this service. The patient expressed understanding and agreed to proceed.   History of Present Illness: HTN, tob dep, Sz disordersince 1988,subdural hematoma(05/2015), on Neurontin for weakness and numbness in RT arm and legand tobaccodependence. This is 6 wk f/u depression and HTN.  Depression: He was started on Zoloft on last visit.  He has been taking it.  Feels the Zoloft has helped a lot.  Feels dose is adequate.  Looks like he had canceled the appointment with the LCSW.  HTN: did get Norvasc and taking it consistently.  Reports his mom and sister are helping to pay for meds.  AST still mildly eval but improved from 2019 from 100 to 65.  I had requested that he return to the lab to have screening test done for hepatitis C and B.  Pt states he drinks three 16 oz cans beer a day.  LDL was 127 with goal being less than 100. Request screen for HIV   Outpatient Encounter Medications as of 06/12/2020  Medication Sig  . amLODipine (NORVASC) 10 MG tablet Take 1 tablet (10 mg total) by mouth daily.  Marland Kitchen gabapentin (NEURONTIN) 300 MG capsule Take 1 capsule (300 mg total) by mouth at bedtime.  . levETIRAcetam (KEPPRA) 500 MG tablet Take 1 tablet (500 mg total) by mouth 2 (two) times daily.  . sertraline (ZOLOFT) 50 MG tablet Take half tablet p.o. daily x4 weeks then 1 tablet daily.   No facility-administered encounter medications on  file as of 06/12/2020.      Observations/Objective: Results for orders placed or performed in visit on 04/28/20  CBC  Result Value Ref Range   WBC 5.8 3.4 - 10.8 x10E3/uL   RBC 4.05 (L) 4.14 - 5.80 x10E6/uL   Hemoglobin 13.7 13.0 - 17.7 g/dL   Hematocrit 40.9 81.1 - 51.0 %   MCV 94 79 - 97 fL   MCH 33.8 (H) 26.6 - 33.0 pg   MCHC 36.0 (H) 31.5 - 35.7 g/dL   RDW 91.4 78.2 - 95.6 %   Platelets 240 150 - 450 x10E3/uL  Comprehensive metabolic panel  Result Value Ref Range   Glucose 79 65 - 99 mg/dL   BUN 8 6 - 24 mg/dL   Creatinine, Ser 2.13 0.76 - 1.27 mg/dL   GFR calc non Af Amer 105 >59 mL/min/1.73   GFR calc Af Amer 121 >59 mL/min/1.73   BUN/Creatinine Ratio 10 9 - 20   Sodium 140 134 - 144 mmol/L   Potassium 4.0 3.5 - 5.2 mmol/L   Chloride 102 96 - 106 mmol/L   CO2 23 20 - 29 mmol/L   Calcium 9.4 8.7 - 10.2 mg/dL   Total Protein 7.6 6.0 - 8.5 g/dL   Albumin 5.0 4.0 - 5.0 g/dL   Globulin, Total 2.6 1.5 - 4.5 g/dL   Albumin/Globulin Ratio 1.9 1.2 - 2.2   Bilirubin Total 0.2 0.0 - 1.2 mg/dL   Alkaline Phosphatase 100 44 - 121 IU/L  AST 65 (H) 0 - 40 IU/L   ALT 17 0 - 44 IU/L  Lipid panel  Result Value Ref Range   Cholesterol, Total 302 (H) 100 - 199 mg/dL   Triglycerides 332 0 - 149 mg/dL   HDL 951 >88 mg/dL   VLDL Cholesterol Cal 19 5 - 40 mg/dL   LDL Chol Calc (NIH) 416 (H) 0 - 99 mg/dL   Chol/HDL Ratio 1.9 0.0 - 5.0 ratio     Assessment and Plan: 1. Essential hypertension Continue amlodipine and low-salt diet.  2. Mild major depression, single episode (HCC) Much improved on Zoloft.  We will continue the medicine for now.  3. Abnormal LFTs Advised that he is consuming too much alcohol in a day.  I recommend to cut back to no more than two 12 ounce beers a day.  He will come to the lab to have screening test for hepatitis B and C.  4. Mixed hyperlipidemia Discussed and encourage healthy eating habits and regular exercise  5. Screening for HIV (human  immunodeficiency virus) - HIV antibody (with reflex); Future   Follow Up Instructions: 4 months   I discussed the assessment and treatment plan with the patient. The patient was provided an opportunity to ask questions and all were answered. The patient agreed with the plan and demonstrated an understanding of the instructions.   The patient was advised to call back or seek an in-person evaluation if the symptoms worsen or if the condition fails to improve as anticipated.  I provided 9 minutes of non-face-to-face time during this encounter.   Jonah Blue, MD

## 2020-07-03 ENCOUNTER — Telehealth: Payer: Self-pay | Admitting: Internal Medicine

## 2020-07-03 NOTE — Telephone Encounter (Signed)
Pt has refills at pharmacy. Please make pt aware

## 2020-07-03 NOTE — Telephone Encounter (Signed)
1) Medication(s) Requested (by name): amLODipine (NORVASC) 10 MG tablet [[id number]]  gabapentin (NEURONTIN) 300 MG capsule [[id number]]   levETIRAcetam (KEPPRA) 500 MG tablet [[id number]]  sertraline (ZOLOFT) 50 MG tablet [[id number]]   2) Pharmacy of Choice:  [facility]   Please advise and thank you

## 2020-07-04 ENCOUNTER — Other Ambulatory Visit: Payer: Medicaid Other

## 2020-08-19 ENCOUNTER — Other Ambulatory Visit: Payer: Self-pay

## 2020-08-19 MED FILL — Levetiracetam Tab 500 MG: ORAL | 30 days supply | Qty: 60 | Fill #0 | Status: AC

## 2020-08-19 MED FILL — Sertraline HCl Tab 50 MG: ORAL | 30 days supply | Qty: 30 | Fill #0 | Status: AC

## 2020-08-19 MED FILL — Amlodipine Besylate Tab 10 MG (Base Equivalent): ORAL | 30 days supply | Qty: 30 | Fill #0 | Status: AC

## 2020-08-19 MED FILL — Gabapentin Cap 300 MG: ORAL | 30 days supply | Qty: 30 | Fill #0 | Status: AC

## 2020-09-02 ENCOUNTER — Other Ambulatory Visit: Payer: Medicaid Other

## 2020-09-08 ENCOUNTER — Ambulatory Visit: Payer: Self-pay | Attending: Internal Medicine

## 2020-09-08 ENCOUNTER — Other Ambulatory Visit: Payer: Self-pay

## 2020-09-08 DIAGNOSIS — R7989 Other specified abnormal findings of blood chemistry: Secondary | ICD-10-CM

## 2020-09-08 DIAGNOSIS — Z114 Encounter for screening for human immunodeficiency virus [HIV]: Secondary | ICD-10-CM

## 2020-09-08 DIAGNOSIS — R945 Abnormal results of liver function studies: Secondary | ICD-10-CM

## 2020-09-09 LAB — HEPATITIS B SURFACE ANTIGEN: Hepatitis B Surface Ag: NEGATIVE

## 2020-09-09 LAB — HEPATITIS B SURFACE ANTIBODY, QUANTITATIVE: Hepatitis B Surf Ab Quant: <3.1 m[IU]/mL — ABNORMAL LOW (ref >9.9)

## 2020-09-09 LAB — HEPATITIS C ANTIBODY: Hep C Virus Ab: <0.1 s/co ratio (ref 0.0–0.9)

## 2020-09-09 LAB — HIV ANTIBODY (ROUTINE TESTING W REFLEX): HIV Screen 4th Generation wRfx: NONREACTIVE

## 2020-09-10 ENCOUNTER — Ambulatory Visit: Payer: Self-pay | Admitting: *Deleted

## 2020-09-10 NOTE — Telephone Encounter (Signed)
Pt given lab results per notes of Dr. Laural Benes on 09/10/20. Pt verbalized understanding. Patient requesting to get hepatitis B vaccine series when he comes in for scheduled appt on 10/10/20. Please advise if patient should come earlier to start vaccination series.

## 2020-09-10 NOTE — Progress Notes (Signed)
Let patient know that his HIV test is negative.  Screen for hepatitis C and B are negative.  He is not immunized against hepatitis B.  If he would like to start the hepatitis B vaccine series he can come as a nurse only visit.

## 2020-09-11 NOTE — Telephone Encounter (Signed)
Called pt message given  

## 2020-10-10 ENCOUNTER — Ambulatory Visit: Payer: Self-pay | Attending: Internal Medicine | Admitting: Internal Medicine

## 2020-10-10 ENCOUNTER — Encounter: Payer: Self-pay | Admitting: Internal Medicine

## 2020-10-10 ENCOUNTER — Other Ambulatory Visit: Payer: Self-pay

## 2020-10-10 DIAGNOSIS — Z23 Encounter for immunization: Secondary | ICD-10-CM

## 2020-10-10 DIAGNOSIS — Z1211 Encounter for screening for malignant neoplasm of colon: Secondary | ICD-10-CM

## 2020-10-10 DIAGNOSIS — G40909 Epilepsy, unspecified, not intractable, without status epilepticus: Secondary | ICD-10-CM

## 2020-10-10 DIAGNOSIS — F17200 Nicotine dependence, unspecified, uncomplicated: Secondary | ICD-10-CM

## 2020-10-10 DIAGNOSIS — F320 Major depressive disorder, single episode, mild: Secondary | ICD-10-CM

## 2020-10-10 DIAGNOSIS — I10 Essential (primary) hypertension: Secondary | ICD-10-CM

## 2020-10-10 MED ORDER — SERTRALINE HCL 50 MG PO TABS
ORAL_TABLET | ORAL | 3 refills | Status: DC
  Filled 2020-10-10: qty 15, 30d supply, fill #0
  Filled 2021-01-12: qty 30, 30d supply, fill #0

## 2020-10-10 MED ORDER — AMLODIPINE BESYLATE 10 MG PO TABS
ORAL_TABLET | Freq: Every day | ORAL | 6 refills | Status: DC
  Filled 2020-10-10 – 2020-11-07 (×2): qty 30, 30d supply, fill #0
  Filled 2021-01-12: qty 30, 30d supply, fill #1
  Filled 2021-03-04: qty 30, 30d supply, fill #2
  Filled 2021-03-17 – 2021-03-25 (×2): qty 30, 30d supply, fill #3
  Filled 2021-06-01: qty 30, 30d supply, fill #0

## 2020-10-10 NOTE — Progress Notes (Signed)
Patient ID: Michael Oneill, male   DOB: 10/13/1975, 45 y.o.   MRN: [id number]  Virtual Visit via Telephone Note  I connected with Gwen Pounds on 10/10/2020 at 1:36 p.m by telephone and verified that I am speaking with the correct person using two identifiers  Location: Patient: home Provider: office  Participants: Myself Patient CMA: Ms. Reginia Forts interpreter:   I discussed the limitations, risks, security and privacy concerns of performing an evaluation and management service by telephone and the availability of in person appointments. I also discussed with the patient that there may be a patient responsible charge related to this service. The patient expressed understanding and agreed to proceed.   History of Present Illness: HTN, tob dep, Sz disorder since 1988,  subdural hematoma (05/2015), on Neurontin for weakness and numbness in RT arm and leg and tobacco dependence, depression.  Last evaluated 05/2020.   Today's visit is for chronic disease management.  Visit was supposed to be in person but was changed to telephone visit due to a shortage of nursing staff.  HYPERTENSION Currently taking: see medication list Med Adherence: [x]  Yes    []  No Medication side effects: []  Yes    [x]  No Adherence with salt restriction: [x]  Yes    []  No Home Monitoring?: []  Yes    [x]  No Monitoring Frequency: []  Yes    []  No Home BP results range: []  Yes    []  No SOB? []  Yes    [x]  No Chest Pain?: []  Yes    [x]  No Leg swelling?: []  Yes    [x]  No Headaches?: []  Yes    [x]  No Dizziness? []  Yes    [x]  No Comments:   Tob dep: still smoking.  Not ready to quit.    SZ ds:  No sz since last visit.  Compliant with keppra.   Still on Zoloft for depression.  Reports he is doing well on the medication.  Requests refill.  HM:  Due for colon CA screen.  He is willing to do the fit test.  He has not had COVID-19 vaccines as yet.  Outpatient Encounter Medications as of 10/10/2020  Medication  Sig   amLODipine (NORVASC) 10 MG tablet TAKE 1 TABLET (10 MG TOTAL) BY MOUTH DAILY.   gabapentin (NEURONTIN) 300 MG capsule TAKE 1 CAPSULE (300 MG TOTAL) BY MOUTH AT BEDTIME.   levETIRAcetam (KEPPRA) 500 MG tablet TAKE 1 TABLET (500 MG TOTAL) BY MOUTH 2 (TWO) TIMES DAILY.   sertraline (ZOLOFT) 50 MG tablet TAKE 1/2 TABLET BY MOUTH ONCE DAILY FOR 4 WEEKS THEN 1 TABLET DAILY.   No facility-administered encounter medications on file as of 10/10/2020.      Observations/Objective: No direct observations done as this was a telephone encounter.  Results for orders placed or performed in visit on 09/08/20  HIV antibody (with reflex)  Result Value Ref Range   HIV Screen 4th Generation wRfx Non Reactive Non Reactive  Hepatitis B surface antibody,quantitative  Result Value Ref Range   Hepatitis B Surf Ab Quant <3.1 (L) Immunity>9.9 mIU/mL  Hepatitis B surface antigen  Result Value Ref Range   Hepatitis B Surface Ag Negative Negative  Hepatitis C Antibody  Result Value Ref Range   Hep C Virus Ab <0.1 0.0 - 0.9 s/co ratio     Assessment and Plan: 1. Essential hypertension Continue amlodipine and low-salt diet.  Give appointment with clinical pharmacist in 1 month for blood pressure check. - amLODipine (NORVASC) 10 MG tablet; TAKE 1  TABLET (10 MG TOTAL) BY MOUTH DAILY.  Dispense: 30 tablet; Refill: 6  2. Seizure disorder (HCC) Continue Keppra.  3. Tobacco dependence Advised to quit.  Patient not ready to give a trial of quitting.  4. Mild major depression, single episode (HCC) - sertraline (ZOLOFT) 50 MG tablet; TAKE 1/2 TABLET BY MOUTH ONCE DAILY FOR 4 WEEKS THEN 1 TABLET DAILY.  Dispense: 30 tablet; Refill: 3  5. Need for COVID-19 vaccine Strongly encouraged him to get the COVID-19 vaccine series to help protect himself and his community.  6.  Colon cancer screening Patient will pick up fit test from our lab when he comes to the pharmacy to get refills on his medicines.   Follow  Up Instructions: 4 mths   I discussed the assessment and treatment plan with the patient. The patient was provided an opportunity to ask questions and all were answered. The patient agreed with the plan and demonstrated an understanding of the instructions.   The patient was advised to call back or seek an in-person evaluation if the symptoms worsen or if the condition fails to improve as anticipated.  I  Spent 7 minutes on this telephone encounter  Jonah Blue, MD

## 2020-10-10 NOTE — Addendum Note (Signed)
Addended by: Jonah Blue B on: 10/10/2020 01:48 PM   Modules accepted: Orders

## 2020-10-17 ENCOUNTER — Other Ambulatory Visit: Payer: Self-pay

## 2020-11-04 ENCOUNTER — Encounter (HOSPITAL_COMMUNITY): Payer: Self-pay | Admitting: *Deleted

## 2020-11-04 ENCOUNTER — Emergency Department (HOSPITAL_COMMUNITY)
Admission: EM | Admit: 2020-11-04 | Discharge: 2020-11-04 | Disposition: A | Discharge status: Home / Self Care | Payer: Self-pay | Attending: Emergency Medicine | Admitting: Emergency Medicine

## 2020-11-04 ENCOUNTER — Emergency Department (HOSPITAL_COMMUNITY): Payer: Self-pay

## 2020-11-04 ENCOUNTER — Other Ambulatory Visit: Payer: Self-pay

## 2020-11-04 DIAGNOSIS — M25511 Pain in right shoulder: Secondary | ICD-10-CM

## 2020-11-04 DIAGNOSIS — I10 Essential (primary) hypertension: Secondary | ICD-10-CM | POA: Diagnosis not present

## 2020-11-04 DIAGNOSIS — S4991XA Unspecified injury of right shoulder and upper arm, initial encounter: Secondary | ICD-10-CM | POA: Diagnosis present

## 2020-11-04 DIAGNOSIS — F17210 Nicotine dependence, cigarettes, uncomplicated: Secondary | ICD-10-CM | POA: Diagnosis not present

## 2020-11-04 DIAGNOSIS — M545 Low back pain, unspecified: Secondary | ICD-10-CM | POA: Insufficient documentation

## 2020-11-04 DIAGNOSIS — S46811A Strain of other muscles, fascia and tendons at shoulder and upper arm level, right arm, initial encounter: Secondary | ICD-10-CM | POA: Insufficient documentation

## 2020-11-04 DIAGNOSIS — Y92410 Unspecified street and highway as the place of occurrence of the external cause: Secondary | ICD-10-CM | POA: Insufficient documentation

## 2020-11-04 DIAGNOSIS — Z79899 Other long term (current) drug therapy: Secondary | ICD-10-CM | POA: Insufficient documentation

## 2020-11-04 DIAGNOSIS — M542 Cervicalgia: Secondary | ICD-10-CM | POA: Diagnosis not present

## 2020-11-04 MED ORDER — ACETAMINOPHEN 500 MG PO TABS
1000.0000 mg | ORAL_TABLET | Freq: Once | ORAL | Status: AC
  Administered 2020-11-04: 1000 mg via ORAL
  Filled 2020-11-04: qty 2

## 2020-11-04 MED ORDER — NAPROXEN 500 MG PO TABS: 500.0000 mg | ORAL_TABLET | Freq: Two times a day (BID) | ORAL | 0 refills | Status: DC

## 2020-11-04 MED ORDER — METHOCARBAMOL 500 MG PO TABS: 500.0000 mg | ORAL_TABLET | Freq: Every evening | ORAL | 0 refills | Status: DC | PRN
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
## 2020-11-04 NOTE — ED Notes (Signed)
All appropriate discharge materials reviewed at length with patient. Time for questions provided. Pt has no other questions at this time and verbalizes understanding of all provided materials.  
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
## 2020-11-04 NOTE — Discharge Instructions (Addendum)
Your x-rays did not show any bony abnormality.  Your pain is likely muscular, and will take time to get better. Take naproxen 2 times a day with meals.  Do not take other anti-inflammatories at the same time (Advil, Motrin, ibuprofen, Aleve). You may supplement with Tylenol if you need further pain control. Use robaxin as needed for muscle stiffness or soreness.  Have caution, this may make you tired or groggy.  Do not drive or operate heavy machinery while taking this medicine. Use ice packs or heating pads if this helps control your pain. You will likely have continued muscle stiffness and soreness over the next couple days.  Follow-up with primary care in 1 week if your symptoms are not improving. Return to the emergency room if you develop vision changes, vomiting, slurred speech, numbness, loss of bowel or bladder control, or any new or worsening symptoms.

## 2020-11-07 ENCOUNTER — Other Ambulatory Visit: Payer: Self-pay

## 2020-11-07 ENCOUNTER — Encounter: Payer: Self-pay | Admitting: Pharmacist

## 2020-11-07 ENCOUNTER — Ambulatory Visit: Payer: Self-pay | Attending: Internal Medicine | Admitting: Pharmacist

## 2020-11-07 VITALS — BP 155/93 | HR 55

## 2020-11-07 DIAGNOSIS — I10 Essential (primary) hypertension: Secondary | ICD-10-CM

## 2020-11-07 MED FILL — Levetiracetam Tab 500 MG: ORAL | 30 days supply | Qty: 60 | Fill #1 | Status: AC

## 2020-11-07 MED FILL — Gabapentin Cap 300 MG: ORAL | 30 days supply | Qty: 30 | Fill #1 | Status: AC

## 2020-11-07 NOTE — Progress Notes (Signed)
   S:    PCP: Dr. Laural Benes  Patient arrives in good spirits. Presents to the clinic for hypertension evaluation, counseling, and management. Patient was referred and last seen by Primary Care Provider on 10/10/2020. That visit occurred via telemedicine. He was instructed to come see me for BP check.   Medication adherence denied. He has been without his amlodipine for 3 days. He plans to pick this up after his appt with me today.   Current BP Medications include:  amlodipine 10 mg daily   Dietary habits include: endorses compliance with salt restriction. Denies drinking excessive caffeine.  Exercise habits include:none  Family / Social history:  -Fhx: heart failure  -Tobacco: 0.25 PPD smoker  -Alcohol: denies use    O:  Vitals:   11/07/20 1342  BP: (!) 155/93  Pulse: (!) 55    Home BP readings: none  Last 3 Office BP readings: BP Readings from Last 3 Encounters:  11/07/20 (!) 155/93  11/04/20 (!) 152/88  04/28/20 (!) 151/94    BMET    Component Value Date/Time   NA 140 04/28/2020 1642   K 4.0 04/28/2020 1642   CL 102 04/28/2020 1642   CO2 23 04/28/2020 1642   GLUCOSE 79 04/28/2020 1642   GLUCOSE 88 11/16/2016 1431   BUN 8 04/28/2020 1642   CREATININE 0.84 04/28/2020 1642   CALCIUM 9.4 04/28/2020 1642   GFRNONAA 105 04/28/2020 1642   GFRAA 121 04/28/2020 1642    Renal function: CrCl cannot be calculated (Patient's most recent lab result is older than the maximum 21 days allowed.).  Clinical ASCVD: No  The ASCVD Risk score Denman George DC Jr., et al., 2013) failed to calculate for the following reasons:   The valid HDL cholesterol range is 20 to 100 mg/dL  A/P: Hypertension longstanding currently uncontrolled on current medications. BP Goal = < 130/ mmHg. Medication adherence is not ideal. I have instructed him to pick up his amlodipine and return in 2-3 weeks for recheck.   -Continued amlodipine. No changes given that he has not taken anything in the last 3 days.    -F/u labs ordered - none -Counseled on lifestyle modifications for blood pressure control including reduced dietary sodium, increased exercise, adequate sleep.  Results reviewed and written information provided.   Total time in face-to-face counseling 15 minutes.   F/U Clinic Visit in 2-3 weeks.    Butch Penny, PharmD, Patsy [place], CPP Clinical Pharmacist [facility] [phone]

## 2020-11-21 ENCOUNTER — Ambulatory Visit: Payer: Medicaid Other | Admitting: Pharmacist

## 2020-11-25 ENCOUNTER — Ambulatory Visit: Payer: Medicaid Other | Admitting: Pharmacist

## 2020-11-27 ENCOUNTER — Telehealth: Payer: Self-pay | Admitting: Internal Medicine

## 2020-11-27 NOTE — Telephone Encounter (Signed)
Copied from CRM [id number]. Topic: Appointment Scheduling - Scheduling Inquiry for Clinic >> Nov 25, 2020 12:01 PM Marylen Ponto wrote: Reason for CRM: Pt requests to reschedule appt with [facility]. Cb# [phone] >> Nov 27, 2020  9:27 AM Jaquita Rector A wrote: Patient called back say that he is awaiting someone to get in contact with him to schedule an appointment with [facility] please call Ph# [phone]   Left patient vm to call [phone] to scheduled appt.

## 2020-12-23 ENCOUNTER — Ambulatory Visit: Payer: Medicaid Other | Admitting: Pharmacist

## 2021-01-06 NOTE — Progress Notes (Signed)
S:    PCP: Dr. Laural Benes  Patient presents to the clinic for hypertension evaluation, counseling, and management. Patient was referred and last seen by Primary Care Provider on 10/10/2020. That visit occurred via telemedicine. At PCP visit on 7/22, BP was 155/93. Pt nonadherent to amlodipine, which was resumed and planned BP recheck in 2-3 weeks, however he has no showed and now we are seeing him 2 months later.   Today, patient reports he is not checking his BP at home. When asked what medication he is taking currently for his BP he is unable to give me a name and says it is "in the chart." When asked if he is taking amlodipine he said yes and when asked how many doses of the last 14 he missed he says "maybe two." However, when asked when he last picked up he could not provide an answer. Our system shows it was last filled when he last saw Franky Macho in July for a 30 day supply so if taking every day he would have run out a month ago. When I mentioned this to him he changed his answer and said he is only taking it every other day and has 3 pills left. Last took it today immediately before appointment. He says he will not fill the prescription today but will come back tomorrow to pick it up from the pharmacy. He is very nonchalant, dismissive, and doesn't seem to understand the seriousness of uncontrolled hypertension even after explanation.   Current BP Medications include:  amlodipine 10 mg daily (taking on average every other day)  Dietary habits include: Endorses compliance with salt restriction. Denies drinking excessive caffeine.  Exercise habits include: None  Family / Social history:  -Fhx: heart failure  -Tobacco: 0.25 PPD smoker  -Alcohol: denies use    O:  Vitals:   01/07/21 1503  BP: (!) 152/95  Pulse: 83    Home BP readings: not checking  Last 3 Office BP readings: BP Readings from Last 3 Encounters:  01/07/21 (!) 152/95  11/07/20 (!) 155/93  11/04/20 (!) 152/88    BMET     Component Value Date/Time   NA 140 04/28/2020 1642   K 4.0 04/28/2020 1642   CL 102 04/28/2020 1642   CO2 23 04/28/2020 1642   GLUCOSE 79 04/28/2020 1642   GLUCOSE 88 11/16/2016 1431   BUN 8 04/28/2020 1642   CREATININE 0.84 04/28/2020 1642   CALCIUM 9.4 04/28/2020 1642   GFRNONAA 105 04/28/2020 1642   GFRAA 121 04/28/2020 1642    Renal function: CrCl cannot be calculated (Patient's most recent lab result is older than the maximum 21 days allowed.).  Clinical ASCVD: No  The ASCVD Risk score (Arnett DK, et al., 2019) failed to calculate for the following reasons:   The valid HDL cholesterol range is 20 to 100 mg/dL  A/P: Hypertension longstanding currently uncontrolled on current medications and nearly unchanged since last visit when he was not taking amlodipine at all. BP Goal = < 130/80 mmHg. Medication adherence is not ideal, reports he takes it every other day though initially said he rarely missed doses until further questioned. Unable to see impact of amlodipine today since he took it immediately before his appointment and not regularly in the days prior. I have instructed him to pick up his amlodipine and take this daily until his next visit with Dr. Laural Benes. Discussed severity of long-term effects of uncontrolled hypertension and importance of adherence. Provided examples of techniques to increase  adherence such as a pill box or setting an alarm to remind him to take it. He states he will pick up his amlodipine tomorrow.  -Continued amlodipine 10 mg daily. No changes given that he is not adherent.  -Counseled on lifestyle modifications for blood pressure control including reduced dietary sodium, increased exercise, adequate sleep.   Total time in face-to-face counseling 15 minutes. F/u PCP Visit on October 24th.    Pervis Hocking, PharmD PGY2 Ambulatory Care Pharmacy Resident 01/07/2021 3:17 PM

## 2021-01-07 ENCOUNTER — Ambulatory Visit: Payer: Self-pay | Attending: Internal Medicine | Admitting: Pharmacist

## 2021-01-07 ENCOUNTER — Other Ambulatory Visit: Payer: Self-pay

## 2021-01-07 VITALS — BP 152/95 | HR 83

## 2021-01-07 DIAGNOSIS — I10 Essential (primary) hypertension: Secondary | ICD-10-CM

## 2021-01-12 ENCOUNTER — Other Ambulatory Visit: Payer: Self-pay

## 2021-01-12 MED FILL — Gabapentin Cap 300 MG: ORAL | 30 days supply | Qty: 30 | Fill #2 | Status: AC

## 2021-01-12 MED FILL — Levetiracetam Tab 500 MG: ORAL | 30 days supply | Qty: 60 | Fill #2 | Status: AC

## 2021-01-13 ENCOUNTER — Other Ambulatory Visit: Payer: Self-pay

## 2021-02-09 ENCOUNTER — Ambulatory Visit: Payer: Medicaid Other | Admitting: Internal Medicine

## 2021-03-04 ENCOUNTER — Telehealth: Payer: Self-pay | Admitting: Internal Medicine

## 2021-03-04 ENCOUNTER — Other Ambulatory Visit: Payer: Self-pay

## 2021-03-04 MED FILL — Levetiracetam Tab 500 MG: ORAL | 30 days supply | Qty: 60 | Fill #3 | Status: AC

## 2021-03-04 MED FILL — Gabapentin Cap 300 MG: ORAL | 30 days supply | Qty: 30 | Fill #3 | Status: AC

## 2021-03-04 NOTE — Telephone Encounter (Signed)
Disregard

## 2021-03-05 ENCOUNTER — Other Ambulatory Visit: Payer: Self-pay

## 2021-03-17 ENCOUNTER — Other Ambulatory Visit: Payer: Self-pay

## 2021-03-17 MED FILL — Levetiracetam Tab 500 MG: ORAL | 30 days supply | Qty: 60 | Fill #4 | Status: CN

## 2021-03-24 ENCOUNTER — Other Ambulatory Visit: Payer: Self-pay

## 2021-03-25 ENCOUNTER — Other Ambulatory Visit: Payer: Self-pay

## 2021-03-25 MED FILL — Levetiracetam Tab 500 MG: ORAL | 30 days supply | Qty: 60 | Fill #4 | Status: AC

## 2021-03-31 ENCOUNTER — Ambulatory Visit: Payer: Medicaid Other | Admitting: Internal Medicine

## 2021-05-28 ENCOUNTER — Ambulatory Visit: Payer: Medicaid Other | Admitting: Internal Medicine

## 2021-06-01 ENCOUNTER — Other Ambulatory Visit: Payer: Self-pay

## 2021-06-08 ENCOUNTER — Other Ambulatory Visit: Payer: Self-pay

## 2021-06-19 ENCOUNTER — Ambulatory Visit: Payer: Medicaid Other | Admitting: Internal Medicine

## 2021-08-31 ENCOUNTER — Other Ambulatory Visit: Payer: Self-pay | Admitting: Internal Medicine

## 2021-08-31 ENCOUNTER — Ambulatory Visit: Payer: Medicaid Other | Admitting: Internal Medicine

## 2021-08-31 DIAGNOSIS — G40909 Epilepsy, unspecified, not intractable, without status epilepticus: Secondary | ICD-10-CM

## 2021-08-31 DIAGNOSIS — I10 Essential (primary) hypertension: Secondary | ICD-10-CM

## 2021-08-31 DIAGNOSIS — G629 Polyneuropathy, unspecified: Secondary | ICD-10-CM

## 2021-08-31 NOTE — Telephone Encounter (Signed)
Pt called in to request a refill for 3 medications. Pt says that he is completely out of his BP medication. Pt rescheduled his appt for today to next available (August)  ? ?amLODipine (NORVASC) 10 MG tablet ?gabapentin (NEURONTIN) 300 MG capsule ?levETIRAcetam (KEPPRA) 500 MG tablet ? ?Pharmacy:  ?[facility] Phone:  [phone]  ?Fax:  [phone]  ?  ? ? ?Future ov: 11/2521  ?

## 2021-09-01 NOTE — Telephone Encounter (Signed)
Requested medication (s) are due for refill today:   Yes for all 3 ? ?Requested medication (s) are on the active medication list:   Yes for all 3 ? ?Future visit scheduled:   Yes.   He has cancelled his last 5 appts and rescheduled his appt for 08/31/2021 until August 2023.  LOV 01/07/2021 ? ? ?Last ordered: Amlodipine 10/10/2020 #30, 6 refills - out of this med.;  gabapentin 04/28/2020 #30, 6 refills;   Keppra 04/28/2020 #60, 6 refills ?Returned for provider review.  ? ?Requested Prescriptions  ?Pending Prescriptions Disp Refills  ? amLODipine (NORVASC) 10 MG tablet 30 tablet 6  ?  Sig: TAKE 1 TABLET (10 MG TOTAL) BY MOUTH DAILY.  ?  ? Cardiovascular: Calcium Channel Blockers 2 Failed - 08/31/2021  2:22 PM  ?  ?  Failed - Last BP in normal range  ?  BP Readings from Last 1 Encounters:  ?01/07/21 (!) 152/95  ?   ?  ?  Failed - Valid encounter within last 6 months  ?  Recent Outpatient Visits   ? ?      ? 7 months ago Essential hypertension  ? [facility] Lois Huxley, Cornelius Moras, RPH-CPP  ? 9 months ago Essential hypertension  ? [facility] Lois Huxley, Cornelius Moras, RPH-CPP  ? 10 months ago Essential hypertension  ? [facility] Marcine Matar, MD  ? 1 year ago Essential hypertension  ? [facility] Marcine Matar, MD  ? 1 year ago Essential hypertension  ? [facility] Marcine Matar, MD  ? ?  ?  ?Future Appointments   ? ?        ? In 3 months Marcine Matar, MD [facility]  ? ?  ? ? ?  ?  ?  Passed - Last Heart Rate in normal range  ?  Pulse Readings from Last 1 Encounters:  ?01/07/21 83  ?   ?  ?  ? gabapentin (NEURONTIN) 300 MG capsule 30 capsule 6  ?  Sig: TAKE 1 CAPSULE (300 MG TOTAL) BY MOUTH AT BEDTIME.  ?  ? Neurology: Anticonvulsants - gabapentin Failed - 08/31/2021  2:22 PM  ?  ?  Failed - Cr in normal range and within 360  days  ?  Creatinine, Ser  ?Date Value Ref Range Status  ?04/28/2020 0.84 0.76 - 1.27 mg/dL Final  ?   ?  ?  Failed - Completed PHQ-2 or PHQ-9 in the last 360 days  ?  ?  Passed - Valid encounter within last 12 months  ?  Recent Outpatient Visits   ? ?      ? 7 months ago Essential hypertension  ? [facility] Lois Huxley, Cornelius Moras, RPH-CPP  ? 9 months ago Essential hypertension  ? [facility] Lois Huxley, Cornelius Moras, RPH-CPP  ? 10 months ago Essential hypertension  ? [facility] Marcine Matar, MD  ? 1 year ago Essential hypertension  ? [facility] Marcine Matar, MD  ? 1 year ago Essential hypertension  ? [facility] Marcine Matar, MD  ? ?  ?  ?Future Appointments   ? ?        ? In 3 months Laural Benes,  Binnie Rail, MD [facility]  ? ?  ? ? ?  ?  ?  ? levETIRAcetam (KEPPRA) 500 MG tablet 60 tablet 6  ?  Sig: TAKE 1 TABLET (500 MG TOTAL) BY MOUTH 2 (TWO) TIMES DAILY.  ?  ? Neurology:  Anticonvulsants - levetiracetam Failed - 08/31/2021  2:22 PM  ?  ?  Failed - Cr in normal range and within 360 days  ?  Creatinine, Ser  ?Date Value Ref Range Status  ?04/28/2020 0.84 0.76 - 1.27 mg/dL Final  ?   ?  ?  Failed - Completed PHQ-2 or PHQ-9 in the last 360 days  ?  ?  Passed - Valid encounter within last 12 months  ?  Recent Outpatient Visits   ? ?      ? 7 months ago Essential hypertension  ? [facility] Lois Huxley, Cornelius Moras, RPH-CPP  ? 9 months ago Essential hypertension  ? [facility] Lois Huxley, Cornelius Moras, RPH-CPP  ? 10 months ago Essential hypertension  ? [facility] Marcine Matar, MD  ? 1 year ago Essential hypertension  ? [facility] Marcine Matar, MD  ? 1 year ago Essential hypertension  ? [facility] Marcine Matar, MD  ? ?  ?  ?Future Appointments   ? ?        ? In 3 months Marcine Matar, MD [facility]  ? ?  ? ? ?  ?  ?  ? ?

## 2021-09-02 ENCOUNTER — Other Ambulatory Visit: Payer: Self-pay

## 2021-09-02 MED ORDER — GABAPENTIN 300 MG PO CAPS
ORAL_CAPSULE | Freq: Every day | ORAL | 0 refills | Status: DC
  Filled 2021-09-02 – 2021-10-05 (×2): qty 30, 30d supply, fill #0

## 2021-09-02 MED ORDER — AMLODIPINE BESYLATE 10 MG PO TABS
ORAL_TABLET | Freq: Every day | ORAL | 0 refills | Status: DC
  Filled 2021-09-02 – 2021-10-05 (×2): qty 30, 30d supply, fill #0

## 2021-09-02 MED ORDER — LEVETIRACETAM 500 MG PO TABS
ORAL_TABLET | Freq: Two times a day (BID) | ORAL | 0 refills | Status: DC
  Filled 2021-09-02 – 2021-10-05 (×2): qty 60, 30d supply, fill #0

## 2021-09-09 ENCOUNTER — Other Ambulatory Visit: Payer: Self-pay

## 2021-10-05 ENCOUNTER — Other Ambulatory Visit: Payer: Self-pay

## 2021-11-02 ENCOUNTER — Other Ambulatory Visit: Payer: Self-pay

## 2021-11-02 ENCOUNTER — Emergency Department (HOSPITAL_COMMUNITY)
Admission: EM | Admit: 2021-11-02 | Discharge: 2021-11-02 | Disposition: A | Discharge status: Home / Self Care | Payer: Medicaid Other | Attending: Emergency Medicine | Admitting: Emergency Medicine

## 2021-11-02 DIAGNOSIS — Z5321 Procedure and treatment not carried out due to patient leaving prior to being seen by health care provider: Secondary | ICD-10-CM | POA: Insufficient documentation

## 2021-11-02 DIAGNOSIS — M7989 Other specified soft tissue disorders: Secondary | ICD-10-CM | POA: Insufficient documentation

## 2021-11-02 NOTE — ED Notes (Signed)
Pt called for triage, no answer

## 2021-11-02 NOTE — ED Triage Notes (Signed)
Pt presents with new onset bilateral foot swelling. No hx of same. Pt has hx of seizures and has not taken his meds in 2 days  Pt has had no recent seizures.  Pt has no complaints  of pain or any other symptoms other than the swelling.

## 2021-11-02 NOTE — ED Notes (Signed)
Pt ripped arm band off and handed to this NT and walked out.

## 2021-11-02 NOTE — ED Notes (Signed)
Patient seen walking out while talking on the phone at 2115

## 2021-11-03 ENCOUNTER — Emergency Department (HOSPITAL_BASED_OUTPATIENT_CLINIC_OR_DEPARTMENT_OTHER): Payer: Medicaid Other

## 2021-11-03 ENCOUNTER — Encounter (HOSPITAL_COMMUNITY): Payer: Self-pay | Admitting: Emergency Medicine

## 2021-11-03 ENCOUNTER — Other Ambulatory Visit: Payer: Self-pay

## 2021-11-03 ENCOUNTER — Emergency Department (HOSPITAL_COMMUNITY)
Admission: EM | Admit: 2021-11-03 | Discharge: 2021-11-03 | Disposition: A | Discharge status: Home / Self Care | Payer: Medicaid Other | Attending: Emergency Medicine | Admitting: Emergency Medicine

## 2021-11-03 DIAGNOSIS — Z79899 Other long term (current) drug therapy: Secondary | ICD-10-CM | POA: Insufficient documentation

## 2021-11-03 DIAGNOSIS — R609 Edema, unspecified: Secondary | ICD-10-CM | POA: Insufficient documentation

## 2021-11-03 DIAGNOSIS — R52 Pain, unspecified: Secondary | ICD-10-CM

## 2021-11-03 DIAGNOSIS — I10 Essential (primary) hypertension: Secondary | ICD-10-CM | POA: Insufficient documentation

## 2021-11-03 LAB — BASIC METABOLIC PANEL
Anion gap: 12 (ref 5–15)
BUN: 8 mg/dL (ref 6–20)
CO2: 22 mmol/L (ref 22–32)
Calcium: 8.9 mg/dL (ref 8.9–10.3)
Chloride: 105 mmol/L (ref 98–111)
Creatinine, Ser: 0.77 mg/dL (ref 0.61–1.24)
GFR, Estimated: >60 mL/min (ref >60)
Glucose, Bld: 97 mg/dL (ref 70–99)
Potassium: 4.1 mmol/L (ref 3.5–5.1)
Sodium: 139 mmol/L (ref 135–145)

## 2021-11-03 LAB — CBC
HCT: 39.6 % (ref 39.0–52.0)
Hemoglobin: 13.9 g/dL (ref 13.0–17.0)
MCH: 33.3 pg (ref 26.0–34.0)
MCHC: 35.1 g/dL (ref 30.0–36.0)
MCV: 95 fL (ref 80.0–100.0)
Platelets: 203 10*3/uL (ref 150–400)
RBC: 4.17 MIL/uL — ABNORMAL LOW (ref 4.22–5.81)
RDW: 13.7 % (ref 11.5–15.5)
WBC: 4 10*3/uL (ref 4.0–10.5)
nRBC: 0 % (ref 0.0–0.2)

## 2021-11-03 NOTE — ED Provider Triage Note (Signed)
Emergency Medicine Provider Triage Evaluation Note  Michael Oneill , a 46 y.o. male  was evaluated in triage.  Pt complains of bilateral leg swelling. First noticed same on Saturday. States he has been walking more than normal recently. Denies any history of similar. No chest pain or shortness of breath  Review of Systems  Positive:  Negative:   Physical Exam  BP 126/83 (BP Location: Left Arm)   Pulse 79   Temp 98.3 F (36.8 C) (Oral)   Resp 12   SpO2 99%  Gen:   Awake, no distress   Resp:  Normal effort  MSK:   Moves extremities without difficulty  Other:  Trace BLE swelling noted. Bilateral calf tenderness to palpation. DP and PT pulses intact and 2+  Medical Decision Making  Medically screening exam initiated at 2:09 PM.  Appropriate orders placed.  Grahm Etsitty was informed that the remainder of the evaluation will be completed by another provider, this initial triage assessment does not replace that evaluation, and the importance of remaining in the ED until their evaluation is complete.     Silva Bandy, PA-C 11/03/21 1424

## 2021-11-03 NOTE — Discharge Instructions (Signed)
Make sure that you are resting and keeping her legs elevated.  It appears that you have Norvasc as a blood pressure medication.  This sometimes can increase lower extremity swelling as well.  Talk to your primary physician regarding alternatives if leg swelling continues.

## 2021-11-03 NOTE — ED Provider Notes (Signed)
[facility] EMERGENCY DEPARTMENT Provider Note   CSN: [id number] Arrival date & time: 11/03/21  1240     History  No chief complaint on file.   Michael Oneill is a 46 y.o. male.  HPI     This is a 46 year old male who presents with leg swelling and calf pain.  Patient reports bilateral foot and leg swelling as well as calf pain.  He states he is on his feet most of the day and has been out in the heat.  Denies any recent derangements in diet.  Denies any history of heart failure.  Does have a history of high blood pressure but is unsure of the medications he takes.  No known history of blood clots.  Denies fevers or systemic symptoms including shortness of breath.  Chart reviewed.  It appears he is prescribed Norvasc for hypertension.  Home Medications Prior to Admission medications   Medication Sig Start Date End Date Taking? Authorizing Provider  amLODipine (NORVASC) 10 MG tablet TAKE 1 TABLET (10 MG TOTAL) BY MOUTH DAILY, KEEP APPT FOR FUTURE REFILLS! 09/02/21 09/02/22  Marcine Matar, MD  gabapentin (NEURONTIN) 300 MG capsule TAKE 1 CAPSULE (300 MG TOTAL) BY MOUTH AT BEDTIME, KEEP APPT FOR FUTURE REFILLS! 09/02/21 09/02/22  Marcine Matar, MD  levETIRAcetam (KEPPRA) 500 MG tablet TAKE 1 TABLET (500 MG TOTAL) BY MOUTH 2 (TWO) TIMES DAILY, KEEP APPT FOR FUTURE REFILLS! 09/02/21 09/02/22  Marcine Matar, MD  methocarbamol (ROBAXIN) 500 MG tablet Take 1 tablet (500 mg total) by mouth at bedtime as needed for muscle spasms. 11/04/20   Caccavale, Sophia, PA-C  naproxen (NAPROSYN) 500 MG tablet Take 1 tablet (500 mg total) by mouth 2 (two) times daily with a meal. 11/04/20   Caccavale, Sophia, PA-C  sertraline (ZOLOFT) 50 MG tablet TAKE 1/2 TABLET BY MOUTH ONCE DAILY FOR 4 WEEKS THEN 1 TABLET DAILY. 10/10/20 10/10/21  Marcine Matar, MD      Allergies    Patient has no known allergies.    Review of Systems   Review of Systems  Cardiovascular:  Positive  for leg swelling.  All other systems reviewed and are negative.   Physical Exam Updated Vital Signs BP 136/85 (BP Location: Right Arm)   Pulse 69   Temp 98.5 F (36.9 C) (Oral)   Resp 12   SpO2 97%  Physical Exam Vitals and nursing note reviewed.  Constitutional:      Appearance: He is well-developed. He is not ill-appearing.  HENT:     Head: Normocephalic and atraumatic.  Eyes:     Pupils: Pupils are equal, round, and reactive to light.  Cardiovascular:     Rate and Rhythm: Normal rate and regular rhythm.     Heart sounds: Normal heart sounds. No murmur heard. Pulmonary:     Effort: Pulmonary effort is normal. No respiratory distress.  Abdominal:     Palpations: Abdomen is soft.     Tenderness: There is no abdominal tenderness.  Musculoskeletal:     Cervical back: Neck supple.     Comments: Minimal to trace bilateral lower extremity edema, 2+ DP pulses bilaterally  Lymphadenopathy:     Cervical: No cervical adenopathy.  Skin:    General: Skin is warm and dry.  Neurological:     Mental Status: He is alert and oriented to person, place, and time.  Psychiatric:        Mood and Affect: Mood normal.     ED Results /  Procedures / Treatments   Labs (all labs ordered are listed, but only abnormal results are displayed) Labs Reviewed  CBC - Abnormal; Notable for the following components:      Result Value   RBC 4.17 (*)    All other components within normal limits  BASIC METABOLIC PANEL    EKG None  Radiology VAS [place] LOWER EXTREMITY VENOUS (DVT) (7a-7p)  Result Date: 11/03/2021  Lower Venous DVT Study Patient Name:  Michael Oneill  Date of Exam:   11/03/2021 Medical Rec #: [id number]         Accession #:    [id number] Date of Birth: 1975-10-15        Patient Gender: M Patient Age:   1 years Exam Location:  [facility] Procedure:      VAS [place] LOWER EXTREMITY VENOUS (DVT) Referring Phys: [facility]  --------------------------------------------------------------------------------  Indications: Pain.  Comparison Study: No previous exam noted. Performing Technologist: Magdalene River BS, RVT  Examination Guidelines: A complete evaluation includes B-mode imaging, spectral Doppler, color Doppler, and power Doppler as needed of all accessible portions of each vessel. Bilateral testing is considered an integral part of a complete examination. Limited examinations for reoccurring indications may be performed as noted. The reflux portion of the exam is performed with the patient in reverse Trendelenburg.  +---------+---------------+---------+-----------+----------+--------------+ RIGHT    CompressibilityPhasicitySpontaneityPropertiesThrombus Aging +---------+---------------+---------+-----------+----------+--------------+ CFV      Full           Yes      Yes                                 +---------+---------------+---------+-----------+----------+--------------+ SFJ      Full                                                        +---------+---------------+---------+-----------+----------+--------------+ FV Prox  Full                                                        +---------+---------------+---------+-----------+----------+--------------+ FV Mid   Full                                                        +---------+---------------+---------+-----------+----------+--------------+ FV DistalFull                                                        +---------+---------------+---------+-----------+----------+--------------+ PFV      Full                                                        +---------+---------------+---------+-----------+----------+--------------+ POP  Full           Yes      Yes                                 +---------+---------------+---------+-----------+----------+--------------+ PTV      Full                                                         +---------+---------------+---------+-----------+----------+--------------+ PERO     Full                                                        +---------+---------------+---------+-----------+----------+--------------+   +---------+---------------+---------+-----------+----------+--------------+ LEFT     CompressibilityPhasicitySpontaneityPropertiesThrombus Aging +---------+---------------+---------+-----------+----------+--------------+ CFV      Full           Yes      Yes                                 +---------+---------------+---------+-----------+----------+--------------+ SFJ      Full                                                        +---------+---------------+---------+-----------+----------+--------------+ FV Prox  Full                                                        +---------+---------------+---------+-----------+----------+--------------+ FV Mid   Full                                                        +---------+---------------+---------+-----------+----------+--------------+ FV DistalFull                                                        +---------+---------------+---------+-----------+----------+--------------+ PFV      Full                                                        +---------+---------------+---------+-----------+----------+--------------+ POP      Full           Yes      Yes                                 +---------+---------------+---------+-----------+----------+--------------+  PTV      Full                                                        +---------+---------------+---------+-----------+----------+--------------+ PERO     Full                                                        +---------+---------------+---------+-----------+----------+--------------+     Summary: BILATERAL: - No evidence of deep vein thrombosis seen in the lower extremities,  bilaterally. -No evidence of popliteal cyst, bilaterally.   *See table(s) above for measurements and observations. Electronically signed by Waverly Ferrari MD on 11/03/2021 at 6:28:17 PM.    Final     Procedures Procedures    Medications Ordered in ED Medications - No data to display  ED Course/ Medical Decision Making/ A&P                           Medical Decision Making  This patient presents to the ED for concern of lower extremity swelling and calf pain, this involves an extensive number of treatment options, and is a complaint that carries with it a high risk of complications and morbidity.  I considered the following differential and admission for this acute, potentially life threatening condition.  The differential diagnosis includes dependent edema, DVT, CHF  MDM:    This is a 46 year old male who presents with lower extremity swelling and bilateral calf pain.  He is nontoxic and vital signs are reassuring.  On exam, do not appreciate any significant foot or calf swelling.  He reports that he is on his feet most of the day and has been out in the heat.  This is could be a simple explanation for dependent edema.  It was noted that he is also on Norvasc which can cause some swelling.  Labs obtained and reviewed and are largely unremarkable without significant metabolic derangements.  DVT study without evidence of DVT.  Recommended rest and elevation.  May discuss with his primary physician regarding changing Norvasc to a different medication if lower extremity swelling persist.  (Labs, imaging, consults)  Labs: I Ordered, and personally interpreted labs.  The pertinent results include: CBC, BMP  Imaging Studies ordered: I ordered imaging studies including DVT study I independently visualized and interpreted imaging. I agree with the radiologist interpretation  Additional history obtained from chart review.  External records from outside source obtained and reviewed including  primary care notes  Cardiac Monitoring: The patient was maintained on a cardiac monitor.  I personally viewed and interpreted the cardiac monitored which showed an underlying rhythm of: Sinus rhythm  Reevaluation: After the interventions noted above, I reevaluated the patient and found that they have :improved  Social Determinants of Health: Lives independently  Disposition: Discharge  Co morbidities that complicate the patient evaluation  Past Medical History:  Diagnosis Date   Seizures (HCC)    TBI (traumatic brain injury) (HCC) 1997     Medicines No orders of the defined types were placed in this encounter.   I have reviewed the patients home medicines and have made adjustments as  needed  Problem List / ED Course: Problem List Items Addressed This Visit   None Visit Diagnoses     Dependent edema    -  Primary                   Final Clinical Impression(s) / ED Diagnoses Final diagnoses:  Dependent edema    Rx / DC Orders ED Discharge Orders     None         Shon Baton, MD 11/03/21 2313

## 2021-11-03 NOTE — Progress Notes (Signed)
Bilateral LE venous duplex study completed. Please see CV Proc for preliminary results.  Victorino Dike  Leniya Breit BS, RVT 11/03/2021 3:54 PM

## 2021-11-03 NOTE — ED Notes (Signed)
Called for pt and no answer

## 2021-11-03 NOTE — ED Triage Notes (Signed)
C/o bilateral foot swelling since Saturday and bilateral calf pain.  Seen in ED yesterday for same.

## 2021-11-30 ENCOUNTER — Other Ambulatory Visit: Payer: Self-pay

## 2021-11-30 ENCOUNTER — Other Ambulatory Visit: Payer: Self-pay | Admitting: Internal Medicine

## 2021-11-30 DIAGNOSIS — I10 Essential (primary) hypertension: Secondary | ICD-10-CM

## 2021-11-30 MED ORDER — AMLODIPINE BESYLATE 10 MG PO TABS
ORAL_TABLET | Freq: Every day | ORAL | 0 refills | Status: DC
  Filled 2021-11-30: qty 12, 12d supply, fill #0

## 2021-12-01 ENCOUNTER — Other Ambulatory Visit: Payer: Self-pay

## 2021-12-04 ENCOUNTER — Other Ambulatory Visit: Payer: Self-pay

## 2021-12-11 ENCOUNTER — Other Ambulatory Visit: Payer: Self-pay

## 2021-12-11 ENCOUNTER — Encounter: Payer: Self-pay | Admitting: Internal Medicine

## 2021-12-11 ENCOUNTER — Ambulatory Visit: Payer: Medicaid Other | Attending: Internal Medicine | Admitting: Internal Medicine

## 2021-12-11 VITALS — BP 107/73 | HR 83 | Resp 16 | Wt 139.8 lb

## 2021-12-11 DIAGNOSIS — Z23 Encounter for immunization: Secondary | ICD-10-CM

## 2021-12-11 DIAGNOSIS — Z760 Encounter for issue of repeat prescription: Secondary | ICD-10-CM | POA: Insufficient documentation

## 2021-12-11 DIAGNOSIS — G629 Polyneuropathy, unspecified: Secondary | ICD-10-CM | POA: Insufficient documentation

## 2021-12-11 DIAGNOSIS — F17200 Nicotine dependence, unspecified, uncomplicated: Secondary | ICD-10-CM

## 2021-12-11 DIAGNOSIS — G40909 Epilepsy, unspecified, not intractable, without status epilepticus: Secondary | ICD-10-CM

## 2021-12-11 DIAGNOSIS — F17210 Nicotine dependence, cigarettes, uncomplicated: Secondary | ICD-10-CM

## 2021-12-11 DIAGNOSIS — Z79899 Other long term (current) drug therapy: Secondary | ICD-10-CM | POA: Insufficient documentation

## 2021-12-11 DIAGNOSIS — F321 Major depressive disorder, single episode, moderate: Secondary | ICD-10-CM

## 2021-12-11 DIAGNOSIS — I10 Essential (primary) hypertension: Secondary | ICD-10-CM

## 2021-12-11 DIAGNOSIS — R531 Weakness: Secondary | ICD-10-CM | POA: Insufficient documentation

## 2021-12-11 MED ORDER — GABAPENTIN 300 MG PO CAPS
ORAL_CAPSULE | Freq: Every day | ORAL | 6 refills | Status: DC
Start: 2021-12-11 — End: 2022-12-22
  Filled 2021-12-11: qty 30, 30d supply, fill #0
  Filled 2022-02-18: qty 30, 30d supply, fill #1
  Filled 2022-03-29: qty 30, 30d supply, fill #2
  Filled 2022-04-28 – 2022-05-20 (×2): qty 30, 30d supply, fill #3

## 2021-12-11 MED ORDER — AMLODIPINE BESYLATE 10 MG PO TABS
ORAL_TABLET | Freq: Every day | ORAL | 6 refills | Status: DC
  Filled 2021-12-11: qty 30, 30d supply, fill #0
  Filled 2022-02-18: qty 30, 30d supply, fill #1
  Filled 2022-03-29: qty 30, 30d supply, fill #2
  Filled 2022-04-28 – 2022-05-20 (×2): qty 30, 30d supply, fill #3

## 2021-12-11 MED ORDER — SERTRALINE HCL 50 MG PO TABS
25.0000 mg | ORAL_TABLET | Freq: Every day | ORAL | 1 refills | Status: DC
  Filled 2021-12-11: qty 15, 30d supply, fill #0
  Filled 2022-04-28: qty 15, 30d supply, fill #1

## 2021-12-11 MED ORDER — LEVETIRACETAM 500 MG PO TABS
500.0000 mg | ORAL_TABLET | Freq: Two times a day (BID) | ORAL | 6 refills | Status: DC
  Filled 2021-12-11: qty 60, 30d supply, fill #0
  Filled 2022-02-18: qty 60, 30d supply, fill #1
  Filled 2022-03-29: qty 60, 30d supply, fill #2
  Filled 2022-04-28 – 2022-05-20 (×2): qty 60, 30d supply, fill #3
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
## 2021-12-12 LAB — CBC
Hematocrit: 38.6 % (ref 37.5–51.0)
Hemoglobin: 13.9 g/dL (ref 13.0–17.7)
MCH: 33.9 pg — ABNORMAL HIGH (ref 26.6–33.0)
MCHC: 36 g/dL — ABNORMAL HIGH (ref 31.5–35.7)
MCV: 94 fL (ref 79–97)
Platelets: 242 10*3/uL (ref 150–450)
RBC: 4.1 x10E6/uL — ABNORMAL LOW (ref 4.14–5.80)
RDW: 13.7 % (ref 11.6–15.4)
WBC: 5.4 10*3/uL (ref 3.4–10.8)

## 2021-12-12 LAB — COMPREHENSIVE METABOLIC PANEL
ALT: 23 IU/L (ref 0–44)
AST: 74 IU/L — ABNORMAL HIGH (ref 0–40)
Albumin/Globulin Ratio: 1.9 (ref 1.2–2.2)
Albumin: 5 g/dL (ref 4.1–5.1)
Alkaline Phosphatase: 95 IU/L (ref 44–121)
BUN/Creatinine Ratio: 7 — ABNORMAL LOW (ref 9–20)
BUN: 8 mg/dL (ref 6–24)
Bilirubin Total: <0.2 mg/dL (ref 0.0–1.2)
CO2: 22 mmol/L (ref 20–29)
Calcium: 9.4 mg/dL (ref 8.7–10.2)
Chloride: 100 mmol/L (ref 96–106)
Creatinine, Ser: 1.19 mg/dL (ref 0.76–1.27)
Globulin, Total: 2.7 g/dL (ref 1.5–4.5)
Glucose: 90 mg/dL (ref 70–99)
Potassium: 4.6 mmol/L (ref 3.5–5.2)
Sodium: 141 mmol/L (ref 134–144)
Total Protein: 7.7 g/dL (ref 6.0–8.5)
eGFR: 76 mL/min/{1.73_m2} (ref >59)

## 2021-12-12 LAB — LIPID PANEL
Chol/HDL Ratio: 1.9 ratio (ref 0.0–5.0)
Cholesterol, Total: 307 mg/dL — ABNORMAL HIGH (ref 100–199)
HDL: 158 mg/dL (ref >39)
LDL Chol Calc (NIH): 138 mg/dL — ABNORMAL HIGH (ref 0–99)
Triglycerides: 76 mg/dL (ref 0–149)
VLDL Cholesterol Cal: 11 mg/dL (ref 5–40)

## 2021-12-14 ENCOUNTER — Other Ambulatory Visit: Payer: Self-pay

## 2021-12-15 ENCOUNTER — Telehealth: Payer: Self-pay

## 2021-12-15 NOTE — Telephone Encounter (Signed)
Pt given lab results per notes of Dr. Laural Benes on 12/15/21. Pt verbalized understanding.

## 2021-12-30 ENCOUNTER — Other Ambulatory Visit: Payer: Self-pay

## 2022-02-18 ENCOUNTER — Other Ambulatory Visit: Payer: Self-pay

## 2022-02-19 ENCOUNTER — Other Ambulatory Visit: Payer: Self-pay

## 2022-03-29 ENCOUNTER — Other Ambulatory Visit: Payer: Self-pay

## 2022-04-05 ENCOUNTER — Emergency Department (HOSPITAL_COMMUNITY): Payer: Commercial Managed Care - HMO

## 2022-04-05 ENCOUNTER — Emergency Department (HOSPITAL_COMMUNITY)
Admission: EM | Admit: 2022-04-05 | Discharge: 2022-04-06 | Discharge status: Against Medical Advice | Payer: Commercial Managed Care - HMO | Attending: Emergency Medicine | Admitting: Emergency Medicine

## 2022-04-05 ENCOUNTER — Other Ambulatory Visit: Payer: Self-pay

## 2022-04-05 DIAGNOSIS — Z5321 Procedure and treatment not carried out due to patient leaving prior to being seen by health care provider: Secondary | ICD-10-CM | POA: Diagnosis not present

## 2022-04-05 DIAGNOSIS — R42 Dizziness and giddiness: Secondary | ICD-10-CM | POA: Diagnosis not present

## 2022-04-05 DIAGNOSIS — S01511A Laceration without foreign body of lip, initial encounter: Secondary | ICD-10-CM | POA: Insufficient documentation

## 2022-04-05 DIAGNOSIS — S0993XA Unspecified injury of face, initial encounter: Secondary | ICD-10-CM | POA: Diagnosis present

## 2022-04-05 DIAGNOSIS — R519 Headache, unspecified: Secondary | ICD-10-CM | POA: Insufficient documentation
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
## 2022-04-06 ENCOUNTER — Other Ambulatory Visit: Payer: Self-pay

## 2022-04-06 NOTE — ED Notes (Signed)
Patient called for room x3 

## 2022-04-06 NOTE — ED Notes (Addendum)
No answer when called for treatment room x1 

## 2022-04-07 ENCOUNTER — Other Ambulatory Visit: Payer: Self-pay

## 2022-04-07 ENCOUNTER — Encounter (HOSPITAL_COMMUNITY): Payer: Self-pay

## 2022-04-07 ENCOUNTER — Emergency Department (HOSPITAL_COMMUNITY)
Admission: EM | Admit: 2022-04-07 | Discharge: 2022-04-07 | Discharge status: Against Medical Advice | Payer: Commercial Managed Care - HMO | Attending: Emergency Medicine | Admitting: Emergency Medicine

## 2022-04-07 DIAGNOSIS — S0990XA Unspecified injury of head, initial encounter: Secondary | ICD-10-CM | POA: Diagnosis present

## 2022-04-07 DIAGNOSIS — S060X0A Concussion without loss of consciousness, initial encounter: Secondary | ICD-10-CM | POA: Insufficient documentation

## 2022-04-07 MED ORDER — METHOCARBAMOL 500 MG PO TABS
500.0000 mg | ORAL_TABLET | Freq: Two times a day (BID) | ORAL | 0 refills | Status: DC
Start: 2022-04-07 — End: 2024-01-31
  Filled 2022-04-07 – 2022-04-28 (×2): qty 20, 10d supply, fill #0

## 2022-04-07 NOTE — Discharge Instructions (Addendum)
Please use Tylenol or ibuprofen for pain.  You may use 600 mg ibuprofen every 6 hours or 1000 mg of Tylenol every 6 hours.  You may choose to alternate between the 2.  This would be most effective.  Not to exceed 4 g of Tylenol within 24 hours.  Not to exceed 3200 mg ibuprofen 24 hours.  You can use the muscle relaxant I am prescribing in addition to the above to help with any breakthrough pain.  You can take it up to twice daily.  It is safe to take at night, but I would be cautious taking it during the day as it can cause some drowsiness.  Make sure that you are feeling awake and alert before you get behind the wheel of a car or operate a motor vehicle.  It is not a narcotic pain medication so you are able to take it if it is not making you drowsy and still pilot a vehicle or machinery safely.  The important thing to take into consideration at the head injury is but physical rest and brain rest as we discussed.  I would refrain from any significant mental activity or physical activity.  He may have some lingering dizziness, light sensitivity, headache because of the head injury but the symptoms should begin to resolve in the next several days.  It is somewhat common however to have something called postconcussive syndrome where the symptoms linger for longer than expected, if you are still having significant headaches, light sensitivity, dizziness then I recommend you follow-up with the concussion clinic
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
## 2022-04-13 ENCOUNTER — Other Ambulatory Visit: Payer: Self-pay

## 2022-04-28 ENCOUNTER — Telehealth: Payer: Self-pay | Admitting: Internal Medicine

## 2022-04-28 ENCOUNTER — Other Ambulatory Visit: Payer: Self-pay

## 2022-04-28 NOTE — Telephone Encounter (Signed)
Called & spoke to the patient. Verified name & DOB. Appointment has been scheduled for 05/07/2022 at 9:10 a.m. to address prostate concerns.

## 2022-04-28 NOTE — Telephone Encounter (Signed)
Copied from [place] [id number]. Topic: Referral - Request for Referral >> Apr 28, 2022  2:40 PM Oley Balm A wrote: Pt is wanting to get a referral to have a prostate test done.

## 2022-05-05 ENCOUNTER — Other Ambulatory Visit: Payer: Self-pay

## 2022-05-07 ENCOUNTER — Ambulatory Visit: Payer: Commercial Managed Care - HMO | Attending: Internal Medicine | Admitting: Internal Medicine

## 2022-05-07 ENCOUNTER — Encounter: Payer: Self-pay | Admitting: Internal Medicine

## 2022-05-07 VITALS — BP 125/75 | HR 78 | Temp 98.3°F | Ht 68.0 in | Wt 143.0 lb

## 2022-05-07 DIAGNOSIS — Z1211 Encounter for screening for malignant neoplasm of colon: Secondary | ICD-10-CM

## 2022-05-07 DIAGNOSIS — I10 Essential (primary) hypertension: Secondary | ICD-10-CM

## 2022-05-07 DIAGNOSIS — E782 Mixed hyperlipidemia: Secondary | ICD-10-CM

## 2022-05-07 DIAGNOSIS — Z23 Encounter for immunization: Secondary | ICD-10-CM

## 2022-05-07 DIAGNOSIS — F17200 Nicotine dependence, unspecified, uncomplicated: Secondary | ICD-10-CM

## 2022-05-07 DIAGNOSIS — F321 Major depressive disorder, single episode, moderate: Secondary | ICD-10-CM | POA: Diagnosis not present

## 2022-05-07 DIAGNOSIS — F17210 Nicotine dependence, cigarettes, uncomplicated: Secondary | ICD-10-CM

## 2022-05-07 DIAGNOSIS — G40909 Epilepsy, unspecified, not intractable, without status epilepticus: Secondary | ICD-10-CM

## 2022-05-07 DIAGNOSIS — R7989 Other specified abnormal findings of blood chemistry: Secondary | ICD-10-CM

## 2022-05-07 DIAGNOSIS — Z125 Encounter for screening for malignant neoplasm of prostate: Secondary | ICD-10-CM

## 2022-05-07 DIAGNOSIS — R3581 Nocturnal polyuria: Secondary | ICD-10-CM

## 2022-05-07 NOTE — Progress Notes (Signed)
Patient ID: Michael Oneill, male    DOB: 1975/06/06  MRN: [id number]  CC: Follow-up (Pt requesting prostate exam for preventative measures. [name] received flu vax this season. )   Subjective: Michael Oneill is a 47 y.o. male who presents with prostate concerns His concerns today include:  HTN, tob dep, Sz disorder since 1988,  subdural hematoma (05/2015), on Neurontin for weakness and numbness in RT arm and leg and tobacco dependence, depression.     Wants prostate eval done for preventative maintenance Reports waking up at nights about 3 times at nights to urinate.  Does not feel he completely empties Urine flows freely; no post void dribbling Drinks one 24 oz beer every night.  Drinks Sprite sometimes No fhx of prostate CA  HTN:  taking and tolerating Norvasc daily.  No CP/SOB/LE/dizziness No Sz since last visit. Taking Keppra Depression:  not taking Zoloft consistently.  Feels he does not need it any more.  Doing better in terms of depression LDL was 138 with T.Chol 307.  Had mild persistent elev of AST on LFTs.  Doing okay with eating habits.  Has decreased consumption of  pork and fried foods  Walks daily Still smoking.  Not ready as yet to quit.   HM:  due for colon CA screening.  No fhx of colon CA.  Due for Tdap. Patient Active Problem List   Diagnosis Date Noted   Mixed hyperlipidemia 04/30/2020   Influenza vaccine refused 04/28/2020   Essential hypertension 04/21/2018   Tobacco dependence 04/21/2018   Neuropathy 06/30/2015   Seizures ([place]) 06/03/2015   INGUINAL HERNIA 02/06/2007   HERNIORRHAPHY, HX OF 02/06/2007     Current Outpatient Medications on File Prior to Visit  Medication Sig Dispense Refill   amLODipine (NORVASC) 10 MG tablet TAKE 1 TABLET (10 MG TOTAL) BY MOUTH DAILY, KEEP APPT FOR FUTURE REFILLS! 30 tablet 6   gabapentin (NEURONTIN) 300 MG capsule TAKE 1 CAPSULE (300 MG TOTAL) BY MOUTH AT BEDTIME, KEEP APPT FOR FUTURE REFILLS! 30 capsule 6    levETIRAcetam (KEPPRA) 500 MG tablet Take 1 tablet (500 mg total) by mouth 2 (two) times daily. 60 tablet 6   methocarbamol (ROBAXIN) 500 MG tablet Take 1 tablet (500 mg total) by mouth 2 (two) times daily. 20 tablet 0   sertraline (ZOLOFT) 50 MG tablet Take 0.5 tablets (25 mg total) by mouth daily. 15 tablet 1   No current facility-administered medications on file prior to visit.    No Known Allergies  Social History   Socioeconomic History   Marital status: Single    Spouse name: Not on file   Number of children: Not on file   Years of education: Not on file   Highest education level: Not on file  Occupational History   Not on file  Tobacco Use   Smoking status: Every Day    Packs/day: 0.25    Types: Cigarettes   Smokeless tobacco: Never  Vaping Use   Vaping Use: Never used  Substance and Sexual Activity   Alcohol use: Yes    Comment: OCC   Drug use: No   Sexual activity: Not on file  Other Topics Concern   Not on file  Social History Narrative   Not on file   Social Determinants of Health   Financial Resource Strain: Not on file  Food Insecurity: Not on file  Transportation Needs: Not on file  Physical Activity: Not on file  Stress: Not on file  Social Connections:  Not on file  Intimate Partner Violence: Not on file    Family History  Problem Relation Age of Onset   Heart failure Mother     Past Surgical History:  Procedure Laterality Date   BRAIN SURGERY      ROS: Review of Systems Negative except as stated above  PHYSICAL EXAM: BP 125/75 (BP Location: Left Arm, Patient Position: Sitting, Cuff Size: Normal)   Pulse 78   Temp 98.3 F (36.8 C) (Oral)   Ht 5\' 8"  (1.727 m)   Wt 143 lb (64.9 kg)   SpO2 96%   BMI 21.74 kg/m   Wt Readings from Last 3 Encounters:  05/07/22 143 lb (64.9 kg)  12/11/21 139 lb 12.8 oz (63.4 kg)  04/28/20 148 lb 12.8 oz (67.5 kg)    Physical Exam   General appearance - alert, well appearing, and in no  distress Mental status - normal mood, behavior,  dress, motor activity, and thought processes.  Speech is a little slurred which is baseline for him. Neck - supple, no significant adenopathy Chest - clear to auscultation, no wheezes, rales or rhonchi, symmetric air entry Heart - normal rate, regular rhythm, normal S1, S2, no murmurs, rubs, clicks or gallops Rectal -patient declined rectal exam. Extremities - peripheral pulses normal, no pedal edema, no clubbing or cyanosis      05/07/2022    9:27 AM 12/11/2021    3:53 PM 06/12/2020    2:56 PM  Depression screen PHQ 2/9  Decreased Interest 0 3 0  Down, Depressed, Hopeless 0 3 0  PHQ - 2 Score 0 6 0  Altered sleeping 1 3 0  Tired, decreased energy 0 3 0  Change in appetite 3 3 0  Feeling bad or failure about yourself  2 3 0  Trouble concentrating 1 3 0  Moving slowly or fidgety/restless 0 3 0  Suicidal thoughts 0 3 0  PHQ-9 Score 7 27 0       Latest Ref Rng & Units 12/11/2021    4:45 PM 11/03/2021    2:53 PM 04/28/2020    4:42 PM  CMP  Glucose 70 - 99 mg/dL 90  97  79   BUN 6 - 24 mg/dL 8  8  8    Creatinine 0.76 - 1.27 mg/dL 06/26/2020   7.37   Sodium 134 - 144 mmol/L 141  139  140   Potassium 3.5 - 5.2 mmol/L 4.6  4.1  4.0   Chloride 96 - 106 mmol/L 100  105  102   CO2 20 - 29 mmol/L 22  22  23    Calcium 8.7 - 10.2 mg/dL 9.4  8.9  9.4   Total Protein 6.0 - 8.5 g/dL 7.7   7.6   Total Bilirubin 0.0 - 1.2 mg/dL 1.06   0.2   Alkaline Phos 44 - 121 IU/L 95   100   AST 0 - 40 IU/L 74   65   ALT 0 - 44 IU/L 23   17    Lipid Panel     Component Value Date/Time   CHOL 307 (H) 12/11/2021 1645   TRIG 76 12/11/2021 1645   HDL 158 12/11/2021 1645   CHOLHDL 1.9 12/11/2021 1645   LDLCALC 138 (H) 12/11/2021 1645    CBC    Component Value Date/Time   WBC 5.4 12/11/2021 1645   WBC 4.0 11/03/2021 1453   RBC 4.10 (L) 12/11/2021 1645   RBC 4.17 (L) 11/03/2021 1453  HGB 13.9 12/11/2021 1645   HCT 38.6 12/11/2021 1645   PLT 242  12/11/2021 1645   MCV 94 12/11/2021 1645   MCH 33.9 (H) 12/11/2021 1645   MCH 33.3 11/03/2021 1453   MCHC 36.0 (H) 12/11/2021 1645   MCHC 35.1 11/03/2021 1453   RDW 13.7 12/11/2021 1645   LYMPHSABS 5.7 (H) 06/03/2015 1710   MONOABS 1.9 (H) 06/03/2015 1710   EOSABS 0.1 06/03/2015 1710   BASOSABS 0.1 06/03/2015 1710    ASSESSMENT AND PLAN: 1. Essential hypertension At goal.  Continue Norvasc 10 mg daily  2. Seizure disorder (HCC) Continue Keppra 500 mg twice a day  3. Tobacco dependence Strongly advised to quit.  Patient not ready to give a trial of quitting  4. Nocturnal polyuria Advised patient to avoid drinking alcoholic beverages late at nights as this is likely contributing to urination throughout the night.  Try to cut back to just one 12 ounce beer at night.  On last blood test blood sugar was good.  Symptoms do not totally suggest BPH. - PSA  5. Major depressive disorder, single episode, moderate ([place]) Patient reports he is doing well without Zoloft.  No refills given.  6. Screening for colon cancer Discussed colon cancer screening.  He prefers to do the fit test. - Fecal occult blood, imunochemical([facility])  7. Prostate cancer screening Discussed prostate cancer screening and some of the limitations of it.  Usually start screening in the early 50s but can start in the late 34s and man at high risk including African-American males.  Advised that PSA may or may not be covered by his insurance.  However he would like to have the PSA level checked nonetheless. - PSA  8. Mixed hyperlipidemia Patient advised to eliminate sugary drinks from the diet, cut back on portion sizes especially of white carbohydrates, eat more white lean meat like chicken [place] and seafood instead of beef or pork and incorporate fresh fruits and vegetables into the diet daily.   9. Abnormal LFTs - Hepatic Function Panel  10. Need for Tdap vaccination Given today.     Patient was  given the opportunity to ask questions.  Patient verbalized understanding of the plan and was able to repeat key elements of the plan.   This documentation was completed using Radio producer.  Any transcriptional errors are unintentional.  No orders of the defined types were placed in this encounter.    Requested Prescriptions    No prescriptions requested or ordered in this encounter    No follow-ups on file.  Karle Plumber, MD, FACP

## 2022-05-08 LAB — HEPATIC FUNCTION PANEL
ALT: 26 IU/L (ref 0–44)
AST: 92 IU/L — ABNORMAL HIGH (ref 0–40)
Albumin: 5 g/dL (ref 4.1–5.1)
Alkaline Phosphatase: 85 IU/L (ref 44–121)
Bilirubin Total: 0.4 mg/dL (ref 0.0–1.2)
Bilirubin, Direct: 0.12 mg/dL (ref 0.00–0.40)
Total Protein: 7.8 g/dL (ref 6.0–8.5)

## 2022-05-08 LAB — PSA: Prostate Specific Ag, Serum: 0.9 ng/mL (ref 0.0–4.0)

## 2022-05-10 ENCOUNTER — Ambulatory Visit: Payer: No Typology Code available for payment source

## 2022-05-12 ENCOUNTER — Other Ambulatory Visit: Payer: Self-pay | Admitting: Internal Medicine

## 2022-05-12 DIAGNOSIS — R195 Other fecal abnormalities: Secondary | ICD-10-CM

## 2022-05-12 LAB — FECAL OCCULT BLOOD, IMMUNOCHEMICAL: Fecal Occult Bld: POSITIVE — AB

## 2022-05-14 ENCOUNTER — Encounter: Payer: Self-pay | Admitting: Gastroenterology

## 2022-05-20 ENCOUNTER — Other Ambulatory Visit: Payer: Self-pay

## 2022-05-24 ENCOUNTER — Other Ambulatory Visit: Payer: Self-pay

## 2022-05-26 ENCOUNTER — Other Ambulatory Visit: Payer: Self-pay

## 2022-05-27 ENCOUNTER — Ambulatory Visit (AMBULATORY_SURGERY_CENTER): Payer: Self-pay | Admitting: *Deleted

## 2022-05-27 VITALS — Ht 68.0 in | Wt 145.0 lb

## 2022-05-27 DIAGNOSIS — Z1211 Encounter for screening for malignant neoplasm of colon: Secondary | ICD-10-CM

## 2022-05-27 MED ORDER — PEG 3350-KCL-NA BICARB-NACL 420 G PO SOLR: 4000.0000 mL | Freq: Once | ORAL | 0 refills | Status: AC

## 2022-05-27 NOTE — Progress Notes (Signed)
Last seizure- 2012 per pt, none since then  Pt's previsit is done over the phone and all paperwork (prep instructions) sent to patient.  Pt's name and DOB verified at the beginning of the previsit.  Pt denies any difficulty with ambulating.   No egg or soy allergy known to patient  No issues known to pt with past sedation with any surgeries or procedures Patient denies ever being told they had issues or difficulty with intubation  No FH of Malignant Hyperthermia Pt is not on diet pills Pt is not on  home 02  Pt is not on blood thinners  Pt denies issues with constipation  Pt is not on dialysis Pt denies any upcoming cardiac testing Pt encouraged to use to use Singlecare or Goodrx to reduce cost  Patient's chart reviewed by Osvaldo Angst CNRA prior to previsit and patient appropriate for the [place].  Previsit completed and red dot placed by patient's name on their procedure day (on provider's schedule).

## 2022-06-10 ENCOUNTER — Encounter: Payer: Self-pay | Admitting: Gastroenterology

## 2022-06-11 ENCOUNTER — Telehealth: Payer: Self-pay | Admitting: Gastroenterology

## 2022-06-11 NOTE — Telephone Encounter (Signed)
Patient called and rescheduled his procedure to 07/30/22 please send an updated referral. The patient also requested we resend his prep medication to the pharmacy.

## 2022-06-11 NOTE — Telephone Encounter (Signed)
Spoke with pt. Made him aware that he would need a new pre-visit since it would be over 60 days,pre-visit scheduled for 07/13/22 televisit per pt. Request.

## 2022-06-17 ENCOUNTER — Ambulatory Visit: Payer: Medicaid Other | Admitting: Internal Medicine

## 2022-06-22 ENCOUNTER — Encounter: Payer: Commercial Managed Care - HMO | Admitting: Gastroenterology

## 2022-06-30 ENCOUNTER — Ambulatory Visit: Payer: Medicaid Other | Admitting: Physician Assistant

## 2022-07-13 ENCOUNTER — Ambulatory Visit (AMBULATORY_SURGERY_CENTER): Payer: 59 | Admitting: *Deleted

## 2022-07-13 VITALS — Ht 66.0 in | Wt 143.0 lb

## 2022-07-13 DIAGNOSIS — Z1211 Encounter for screening for malignant neoplasm of colon: Secondary | ICD-10-CM

## 2022-07-13 MED ORDER — NA SULFATE-K SULFATE-MG SULF 17.5-3.13-1.6 GM/177ML PO SOLN: 1.0000 | Freq: Once | ORAL | 0 refills | Status: AC

## 2022-07-13 NOTE — Progress Notes (Signed)
Pt's previsit is done over the phone and all paperwork (prep instructions) sent to patient. Pt's name and DOB verified at the beginning of the previsit. Pt denies any difficulty with ambulating.  No egg or soy allergy known to patient  No issues known to pt with past sedation with any surgeries or procedures Patient denies ever being intubated No FH of Malignant Hyperthermia Pt is not on diet pills Pt is not on  home 02  Pt is not on blood thinners  Pt denies issues with constipation  Pt is not on dialysis Pt denies any upcoming cardiac testing Pt encouraged to use to use Singlecare or Goodrx to reduce cost  Patient's chart reviewed by Osvaldo Angst CNRA prior to previsit and patient appropriate for the [place].  Previsit completed and red dot placed by patient's name on their procedure day (on provider's schedule).  . Visit by phone Pt states her weight is  Instructions reviewed with pt and pt states understanding. Instructed to review again prior to procedure. Pt states they will.  Instructions sent by mail with coupon and by my chart

## 2022-07-21 ENCOUNTER — Encounter: Payer: Self-pay | Admitting: Physician Assistant

## 2022-07-21 ENCOUNTER — Ambulatory Visit: Payer: 59 | Attending: Internal Medicine | Admitting: Physician Assistant

## 2022-07-21 VITALS — BP 113/74 | HR 81 | Temp 98.0°F | Ht 66.0 in | Wt 149.0 lb

## 2022-07-21 DIAGNOSIS — E782 Mixed hyperlipidemia: Secondary | ICD-10-CM | POA: Diagnosis not present

## 2022-07-21 DIAGNOSIS — I10 Essential (primary) hypertension: Secondary | ICD-10-CM | POA: Diagnosis not present

## 2022-07-21 DIAGNOSIS — R748 Abnormal levels of other serum enzymes: Secondary | ICD-10-CM

## 2022-07-21 NOTE — Patient Instructions (Signed)

## 2022-07-21 NOTE — Progress Notes (Signed)
Patient ID: Michael Oneill, male   DOB: June 02, 1975, 47 y.o.   MRN: [id number]     Michael Oneill, is a 47 y.o. male  [id number]  [id number]  DOB - 10-25-1975  Chief Complaint  Patient presents with   Follow-up    Follow up? No med refills needed.         Subjective:   Michael Oneill is a 47 y.o. male here today for rechecking cholesterol and BP.  He has not made diet changes.  He ate steak this morning.  No new issues or concerns.  He does have colonoscopy scheduled for 07/30/2022.    No problems updated.  ALLERGIES: No Known Allergies  PAST MEDICAL HISTORY: Past Medical History:  Diagnosis Date   Hypertension    Seizures    TBI (traumatic brain injury) 1997    MEDICATIONS AT HOME: Prior to Admission medications   Medication Sig Start Date End Date Taking? Authorizing Provider  amLODipine (NORVASC) 10 MG tablet TAKE 1 TABLET (10 MG TOTAL) BY MOUTH DAILY, KEEP APPT FOR FUTURE REFILLS! 12/11/21  Yes Ladell Pier, MD  gabapentin (NEURONTIN) 300 MG capsule TAKE 1 CAPSULE (300 MG TOTAL) BY MOUTH AT BEDTIME, KEEP APPT FOR FUTURE REFILLS! 12/11/21 12/11/22 Yes Ladell Pier, MD  levETIRAcetam (KEPPRA) 500 MG tablet Take 1 tablet (500 mg total) by mouth 2 (two) times daily. 12/11/21  Yes Ladell Pier, MD  methocarbamol (ROBAXIN) 500 MG tablet Take 1 tablet (500 mg total) by mouth 2 (two) times daily. 04/07/22  Yes Prosperi, Christian H, PA-C    ROS: Neg HEENT Neg resp Neg cardiac Neg GI Neg GU Neg MS Neg psych Neg neuro  Objective:   Vitals:   07/21/22 1034  BP: 113/74  Pulse: 81  Temp: 98 F (36.7 C)  TempSrc: Oral  SpO2: 97%  Weight: 149 lb (67.6 kg)  Height: 5\' 6"  (1.676 m)   Exam General appearance : sleepy and difficult to awaken, not in any distress. Speech mumbling and have to ask for clarification several times. Not toxic looking HEENT: Atraumatic and Normocephalic, pupils equally reactive to light and accomodation Neck:  Supple, no JVD. No cervical lymphadenopathy.  Chest: Good air entry bilaterally, CTAB.  No rales/rhonchi/wheezing CVS: S1 S2 regular, no murmurs.  Extremities: B/L Lower Ext shows no edema, both legs are warm to touch Neurology: sleepy and oriented X 3, CN II-XII intact, Non focal Skin: No Rash  Data Review Lab Results  Component Value Date   HGBA1C 5.4 06/30/2015    Assessment & Plan   1. Mixed hyperlipidemia Work on diet-thus far he has not made any recommended changes.  I would like to check his labs fasting bc he was not fasting last time they were checked and he ate steak this morning - Lipid panel; Future  2. Essential hypertension Controlled-continue amlodipine - Comprehensive metabolic panel; Future  3. Elevated liver enzymes Avoid alcohol/tylenol - Comprehensive metabolic panel; Future - Gamma GT; Future - Lipid panel; Future    Return in about 4 months (around 11/20/2022) for Dr Wynetta Emery for chronic conditions.  The patient was given clear instructions to go to ER or return to medical center if symptoms don't improve, worsen or new problems develop. The patient verbalized understanding. The patient was told to call to get lab results if they haven't heard anything in the next week.      Freeman Caldron, PA-C [facility] [place], [place]   07/21/2022, 11:05 AM

## 2022-07-30 ENCOUNTER — Encounter: Payer: Self-pay | Admitting: Gastroenterology

## 2022-07-30 ENCOUNTER — Ambulatory Visit (AMBULATORY_SURGERY_CENTER): Payer: Commercial Managed Care - HMO | Admitting: Gastroenterology

## 2022-07-30 ENCOUNTER — Other Ambulatory Visit: Payer: Self-pay | Admitting: *Deleted

## 2022-07-30 ENCOUNTER — Other Ambulatory Visit (INDEPENDENT_AMBULATORY_CARE_PROVIDER_SITE_OTHER): Payer: Commercial Managed Care - HMO

## 2022-07-30 VITALS — BP 110/77 | HR 50 | Temp 97.5°F | Resp 14 | Ht 66.0 in | Wt 143.0 lb

## 2022-07-30 DIAGNOSIS — D125 Benign neoplasm of sigmoid colon: Secondary | ICD-10-CM

## 2022-07-30 DIAGNOSIS — D122 Benign neoplasm of ascending colon: Secondary | ICD-10-CM | POA: Diagnosis not present

## 2022-07-30 DIAGNOSIS — I10 Essential (primary) hypertension: Secondary | ICD-10-CM | POA: Diagnosis not present

## 2022-07-30 DIAGNOSIS — Z1211 Encounter for screening for malignant neoplasm of colon: Secondary | ICD-10-CM

## 2022-07-30 DIAGNOSIS — D120 Benign neoplasm of cecum: Secondary | ICD-10-CM | POA: Diagnosis not present

## 2022-07-30 DIAGNOSIS — K6389 Other specified diseases of intestine: Secondary | ICD-10-CM | POA: Diagnosis not present

## 2022-07-30 DIAGNOSIS — K635 Polyp of colon: Secondary | ICD-10-CM

## 2022-07-30 DIAGNOSIS — D123 Benign neoplasm of transverse colon: Secondary | ICD-10-CM

## 2022-07-30 LAB — BUN: BUN: 8 mg/dL (ref 6–23)

## 2022-07-30 LAB — CREATININE, SERUM: Creatinine, Ser: 0.92 mg/dL (ref 0.40–1.50)

## 2022-07-30 MED ORDER — SODIUM CHLORIDE 0.9 % IV SOLN: 500.0000 mL | Freq: Once | INTRAVENOUS | Status: DC

## 2022-07-30 NOTE — Patient Instructions (Signed)
Labs today  YOU HAD AN ENDOSCOPIC PROCEDURE TODAY AT THE [facility]:   Refer to the procedure report that was given to you for any specific questions about what was found during the examination.  If the procedure report does not answer your questions, please call your gastroenterologist to clarify.  If you requested that your care partner not be given the details of your procedure findings, then the procedure report has been included in a sealed envelope for you to review at your convenience later.  YOU SHOULD EXPECT: Some feelings of bloating in the abdomen. Passage of more gas than usual.  Walking can help get rid of the air that was put into your GI tract during the procedure and reduce the bloating. If you had a lower endoscopy (such as a colonoscopy or flexible sigmoidoscopy) you may notice spotting of blood in your stool or on the toilet paper. If you underwent a bowel prep for your procedure, you may not have a normal bowel movement for a few days.  Please Note:  You might notice some irritation and congestion in your nose or some drainage.  This is from the oxygen used during your procedure.  There is no need for concern and it should clear up in a day or so.  SYMPTOMS TO REPORT IMMEDIATELY:  Following lower endoscopy (colonoscopy or flexible sigmoidoscopy):  Excessive amounts of blood in the stool  Significant tenderness or worsening of abdominal pains  Swelling of the abdomen that is new, acute  Fever of 100F or higher  For urgent or emergent issues, a gastroenterologist can be reached at any hour by calling [phone]. Do not use MyChart messaging for urgent concerns.    DIET:  We do recommend a small meal at first, but then you may proceed to your regular diet.  Drink plenty of fluids but you should avoid alcoholic beverages for 24 hours.  ACTIVITY:  You should plan to take it easy for the rest of today and you should NOT DRIVE or use heavy machinery until  tomorrow (because of the sedation medicines used during the test).    FOLLOW UP: Our staff will call the number listed on your records the next business day following your procedure.  We will call around 7:15- 8:00 am to check on you and address any questions or concerns that you may have regarding the information given to you following your procedure. If we do not reach you, we will leave a message.     If any biopsies were taken you will be contacted by phone or by letter within the next 1-3 weeks.  Please call us at [phone] if you have not heard about the biopsies in 3 weeks.    SIGNATURES/CONFIDENTIALITY: You and/or your care partner have signed paperwork which will be entered into your electronic medical record.  These signatures attest to the fact that that the information above on your After Visit Summary has been reviewed and is understood.  Full responsibility of the confidentiality of this discharge information lies with you and/or your care-partner.

## 2022-07-30 NOTE — Op Note (Signed)
[facility] Patient Name: Michael Oneill Procedure Date: 07/30/2022 11:37 AM MRN: [id number] Endoscopist: Napoleon Form , MD, [id number] Age: 47 Referring MD:  Date of Birth: January 02, 1976 Gender: Male Account #: [id number] Procedure:                Colonoscopy Indications:              Screening for colorectal malignant neoplasm Medicines:                Monitored Anesthesia Care Procedure:                Pre-Anesthesia Assessment:                           - Prior to the procedure, a History and Physical                            was performed, and patient medications and                            allergies were reviewed. The patient's tolerance of                            previous anesthesia was also reviewed. The risks                            and benefits of the procedure and the sedation                            options and risks were discussed with the patient.                            All questions were answered, and informed consent                            was obtained. Prior Anticoagulants: The patient has                            taken no anticoagulant or antiplatelet agents. ASA                            Grade Assessment: II - A patient with mild systemic                            disease. After reviewing the risks and benefits,                            the patient was deemed in satisfactory condition to                            undergo the procedure.                           After obtaining informed consent, the colonoscope  was passed under direct vision. Throughout the                            procedure, the patient's blood pressure, pulse, and                            oxygen saturations were monitored continuously. The                            Olympus PCF-H190DL ([id number]) Colonoscope was                            introduced through the anus and advanced to the the                             cecum, identified by appendiceal orifice and                            ileocecal valve. The colonoscopy was somewhat                            difficult due to inadequate bowel prep. Successful                            completion of the procedure was aided by lavage.                            The patient tolerated the procedure well. The                            quality of the bowel preparation was adequate to                            identify polyps. The ileocecal valve, appendiceal                            orifice, and rectum were photographed. Scope In: 11:44:39 AM Scope Out: 12:22:40 PM Scope Withdrawal Time: 0 hours 31 minutes 59 seconds  Total Procedure Duration: 0 hours 38 minutes 1 second  Findings:                 The perianal and digital rectal examinations were                            normal.                           Two sessile polyps were found in the ascending                            colon and cecum. The polyps were 8 to 11 mm in                            size. These polyps were removed with a cold snare.  Resection and retrieval were complete.                           Two pedunculated polyps were found in the sigmoid                            colon and hepatic flexure. The polyps were 14 to 18                            mm in size. These polyps were removed with a hot                            snare. Resection and retrieval were complete.                           A polypoid partially obstructing large mass was                            found in the transverse colon. The mass was                            partially circumferential (involving one-third of                            the lumen circumference). No bleeding was present.                            Biopsies were taken with a cold forceps for                            histology. Area was tattooed with an injection of 2                            mL of Spot (carbon  black).                           Scattered small-mouthed diverticula were found in                            the sigmoid colon and ascending colon.                           Non-bleeding external and internal hemorrhoids were                            found during retroflexion. The hemorrhoids were                            medium-sized. Complications:            No immediate complications. Estimated Blood Loss:     Estimated blood loss was minimal. Impression:               - Two 8 to 11 mm polyps in the ascending colon and  in the cecum, removed with a cold snare. Resected                            and retrieved.                           - Two 14 to 18 mm polyps in the sigmoid colon and                            at the hepatic flexure, removed with a hot snare.                            Resected and retrieved.                           - Rule out malignancy, partially obstructing tumor                            in the transverse colon. Biopsied. Tattooed.                           - Diverticulosis in the sigmoid colon and in the                            ascending colon.                           - Non-bleeding external and internal hemorrhoids. Recommendation:           - Patient has a contact number available for                            emergencies. The signs and symptoms of potential                            delayed complications were discussed with the                            patient. Return to normal activities tomorrow.                            Written discharge instructions were provided to the                            patient.                           - Resume previous diet.                           - Continue present medications.                           - Await pathology results.                           -  Repeat colonoscopy in 1 year for surveillance                            based on pathology results.                            - CT chest, abd & pelvis with contrast based on                            biopsy results                           - Check BUN/Cr Napoleon Form, MD 07/30/2022 12:32:02 PM This report has been signed electronically.

## 2022-07-30 NOTE — Progress Notes (Signed)
[facility] History and Physical   Primary Care Physician:  Marcine Matar, MD   Reason for Procedure:  Colorectal cancer screening  Plan:    Screening colonoscopy with possible interventions as needed     HPI: Michael Oneill is a very pleasant 47 y.o. male here for screening colonoscopy. Denies any nausea, vomiting, abdominal pain, melena or bright red blood per rectum  The risks and benefits as well as alternatives of endoscopic procedure(s) have been discussed and reviewed. All questions answered. The patient agrees to proceed.    Past Medical History:  Diagnosis Date   Hypertension    Seizures    TBI (traumatic brain injury) 1997    Past Surgical History:  Procedure Laterality Date   BRAIN SURGERY     1997    Prior to Admission medications   Medication Sig Start Date End Date Taking? Authorizing Provider  amLODipine (NORVASC) 10 MG tablet TAKE 1 TABLET (10 MG TOTAL) BY MOUTH DAILY, KEEP APPT FOR FUTURE REFILLS! 12/11/21  Yes Marcine Matar, MD  gabapentin (NEURONTIN) 300 MG capsule TAKE 1 CAPSULE (300 MG TOTAL) BY MOUTH AT BEDTIME, KEEP APPT FOR FUTURE REFILLS! 12/11/21 12/11/22 Yes Marcine Matar, MD  levETIRAcetam (KEPPRA) 500 MG tablet Take 1 tablet (500 mg total) by mouth 2 (two) times daily. 12/11/21  Yes Marcine Matar, MD  methocarbamol (ROBAXIN) 500 MG tablet Take 1 tablet (500 mg total) by mouth 2 (two) times daily. 04/07/22   Prosperi, Christian H, PA-C    Current Outpatient Medications  Medication Sig Dispense Refill   amLODipine (NORVASC) 10 MG tablet TAKE 1 TABLET (10 MG TOTAL) BY MOUTH DAILY, KEEP APPT FOR FUTURE REFILLS! 30 tablet 6   gabapentin (NEURONTIN) 300 MG capsule TAKE 1 CAPSULE (300 MG TOTAL) BY MOUTH AT BEDTIME, KEEP APPT FOR FUTURE REFILLS! 30 capsule 6   levETIRAcetam (KEPPRA) 500 MG tablet Take 1 tablet (500 mg total) by mouth 2 (two) times daily. 60 tablet 6   methocarbamol (ROBAXIN) 500 MG tablet Take 1 tablet  (500 mg total) by mouth 2 (two) times daily. 20 tablet 0   Current Facility-Administered Medications  Medication Dose Route Frequency Provider Last Rate Last Admin   0.9 %  sodium chloride infusion  500 mL Intravenous Once Napoleon Form, MD        Allergies as of 07/30/2022   (No Known Allergies)    Family History  Problem Relation Age of Onset   Heart failure Mother    Colon cancer Neg Hx    Esophageal cancer Neg Hx    Rectal cancer Neg Hx    Stomach cancer Neg Hx    Colon polyps Neg Hx     Social History   Socioeconomic History   Marital status: Single    Spouse name: Not on file   Number of children: Not on file   Years of education: Not on file   Highest education level: Not on file  Occupational History   Not on file  Tobacco Use   Smoking status: Every Day    Packs/day: .25    Types: Cigarettes   Smokeless tobacco: Never  Vaping Use   Vaping Use: Never used  Substance and Sexual Activity   Alcohol use: Yes    Comment: OCC   Drug use: No   Sexual activity: Not on file  Other Topics Concern   Not on file  Social History Narrative   Not on file   Social Determinants of  Health   Financial Resource Strain: Not on file  Food Insecurity: Not on file  Transportation Needs: Not on file  Physical Activity: Not on file  Stress: Not on file  Social Connections: Not on file  Intimate Partner Violence: Not on file    Review of Systems:  All other review of systems negative except as mentioned in the HPI.  Physical Exam: Vital signs in last 24 hours: Blood Pressure (Abnormal) 146/101   Pulse 67   Temperature (Abnormal) 97.5 F (36.4 C)   Height 5\' 6"  (1.676 m)   Weight 143 lb (64.9 kg)   Oxygen Saturation 100%   Body Mass Index 23.08 kg/m  General:   Alert, NAD Lungs:  Clear .   Heart:  Regular rate and rhythm Abdomen:  Soft, nontender and nondistended. Neuro/Psych:  Alert and cooperative. Normal mood and affect. A and O x 3  Reviewed labs,  radiology imaging, old records and pertinent past GI work up  Patient is appropriate for planned procedure(s) and anesthesia in an ambulatory setting   K. Scherry Ran , MD [phone]

## 2022-07-30 NOTE — Progress Notes (Signed)
Report to PACU, RN, vss, BBS= Clear.  

## 2022-07-30 NOTE — Progress Notes (Signed)
Called to room to assist during endoscopic procedure.  Patient ID and intended procedure confirmed with present staff. Received instructions for my participation in the procedure from the performing physician.  

## 2022-08-02 ENCOUNTER — Telehealth: Payer: Self-pay

## 2022-08-02 ENCOUNTER — Telehealth: Payer: Self-pay | Admitting: Gastroenterology

## 2022-08-02 ENCOUNTER — Encounter: Payer: Self-pay | Admitting: *Deleted

## 2022-08-02 NOTE — Telephone Encounter (Signed)
Printed work excuse for patient Will leave out front for patient

## 2022-08-02 NOTE — Telephone Encounter (Signed)
  Follow up Call-     07/30/2022   10:47 AM  Call back number  Post procedure Call Back phone  # [phone]  Permission to leave phone message Yes     Patient questions:  Do you have a fever, pain , or abdominal swelling? No. Pain Score  0 *  Have you tolerated food without any problems? Yes.    Have you been able to return to your normal activities? Yes.    Do you have any questions about your discharge instructions: Diet   No. Medications  No. Follow up visit  No.  Do you have questions or concerns about your Care? No.  Actions: * If pain score is 4 or above: No action needed, pain <4.

## 2022-08-02 NOTE — Telephone Encounter (Signed)
Patient called said he just had a procedure and  would like a letter to excuse him from work.

## 2022-08-02 NOTE — Telephone Encounter (Signed)
Yes its fine to give patient a work excuse note for the day of procedure. Thanks

## 2022-08-03 NOTE — Telephone Encounter (Signed)
Gave to [place] so the pt could pick the note up

## 2022-08-10 ENCOUNTER — Other Ambulatory Visit: Payer: Self-pay

## 2022-08-10 DIAGNOSIS — Z1211 Encounter for screening for malignant neoplasm of colon: Secondary | ICD-10-CM

## 2022-08-12 ENCOUNTER — Encounter: Payer: Self-pay | Admitting: *Deleted

## 2022-08-12 ENCOUNTER — Telehealth: Payer: Self-pay | Admitting: *Deleted

## 2022-08-12 DIAGNOSIS — K6389 Other specified diseases of intestine: Secondary | ICD-10-CM

## 2022-08-12 DIAGNOSIS — Z86010 Personal history of colonic polyps: Secondary | ICD-10-CM

## 2022-08-12 NOTE — Telephone Encounter (Signed)
Patient is scheduled for a colonscopy at [facility] on 08/26/2022 at 1pm. I put gastro referral in and will contact patient and mail him instructions today  Plenvu kit sample

## 2022-08-12 NOTE — Telephone Encounter (Signed)
Called patient and spoke to him about his colonoscopy at [facility] for 5/9  He will see me tomorrow afternoon so I can go over everything with him and give him his Plenvu sample

## 2022-08-16 ENCOUNTER — Other Ambulatory Visit: Payer: Self-pay | Admitting: *Deleted

## 2022-08-16 DIAGNOSIS — K6389 Other specified diseases of intestine: Secondary | ICD-10-CM

## 2022-08-17 ENCOUNTER — Encounter: Payer: Self-pay | Admitting: *Deleted

## 2022-08-17 NOTE — Telephone Encounter (Signed)
Patient did not show up for his instructions and prep on Friday. He said he would be here on Wednesday.  For the CT scan he can do any day this week. So I will call scheduling and schedule this for him. I will give him contrast and instructions for that too.   We have scheduled your CT scan at [facility]                                                              [address]                                                             [place] [address]    You have been scheduled for a CT scan of the abdomen and pelvis at [facility], 1st floor Radiology. You are scheduled on 08/21/2022 at 12:45 pm. You should arrive 15 minutes prior to your appointment time for registration.  We are giving you 2 bottles of contrast today that you will need to drink before arriving for the exam. The solution may taste better if refrigerated so put them in the refrigerator when you get home, but do NOT add ice or any other liquid to this solution as that would dilute it. Shake well before drinking.   Please follow the written instructions below on the day of your exam:   1) Do not eat anything after 8:45 am (4 hours prior to your test)   2) Drink 1 bottle of contrast @ 10:45 am (2 hours prior to your exam)  Remember to shake well before drinking and do NOT pour over ice.     Drink 1 bottle of contrast @ 11:45 am (1 hour prior to your exam)   You may take any medications as prescribed with a small amount of water, if necessary. If you take any of the following medications: METFORMIN, GLUCOPHAGE, GLUCOVANCE, AVANDAMET, RIOMET, FORTAMET, ACTOPLUS MET, JANUMET, GLUMETZA or METAGLIP, you MAY be asked to HOLD this medication 48 hours AFTER the exam.   The purpose of you drinking the oral contrast is to aid in the visualization of your intestinal tract. The contrast solution may cause some diarrhea. Depending on your individual set of symptoms, you may also receive an intravenous injection of  x-ray contrast/dye. Plan on being at [facility] for 45 minutes or longer, depending on the type of exam you are having performed.   If you have any questions regarding your exam or if you need to reschedule, you may call [facility] at [phone] between the hours of 8:00 am and 5:00 pm, Monday-Friday.

## 2022-08-18 ENCOUNTER — Telehealth: Payer: Self-pay | Admitting: Gastroenterology

## 2022-08-18 NOTE — Telephone Encounter (Signed)
PT has a CT scheduled for 5/4. Advised by Zella Ball to come to office to pick up instructions and prep. Patient will be coming tomorrow

## 2022-08-19 ENCOUNTER — Encounter (HOSPITAL_COMMUNITY): Payer: Self-pay | Admitting: Gastroenterology

## 2022-08-19 NOTE — Progress Notes (Signed)
Attempted to obtain medical history via telephone, unable to reach at this time. HIPAA compliant voicemail message left requesting return call to pre surgical testing department. 

## 2022-08-20 NOTE — Telephone Encounter (Signed)
Left message for patient that he has still not come picked up colon instructions or instructions and contrast for CT scan scheduled for [facility] tomorrow at 1 pm. Explained in detailed message that he will need to be at [facility] around 10 am  so they can give him oral contrast if he is not making it up here to see [place] today they will have to give it to him there . Marland Kitchen

## 2022-08-21 ENCOUNTER — Ambulatory Visit (HOSPITAL_BASED_OUTPATIENT_CLINIC_OR_DEPARTMENT_OTHER): Admission: RE | Admit: 2022-08-21 | Payer: Medicaid Other | Source: Ambulatory Visit

## 2022-08-23 ENCOUNTER — Ambulatory Visit (HOSPITAL_BASED_OUTPATIENT_CLINIC_OR_DEPARTMENT_OTHER): Admission: RE | Admit: 2022-08-23 | Payer: Medicaid Other | Source: Ambulatory Visit

## 2022-08-25 ENCOUNTER — Telehealth: Payer: Self-pay | Admitting: Gastroenterology

## 2022-08-25 NOTE — Telephone Encounter (Signed)
Inbound call from patient, wishing to cancel his procedure and be placed on a wait list due to a family emergency.

## 2022-08-25 NOTE — Telephone Encounter (Signed)
Procedure report, pathology report and demographics faxed to [facility].

## 2022-08-25 NOTE — Telephone Encounter (Signed)
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 If he cancels this procedure, there is possibility that we will not be able to do repeat colonoscopy for at least next 6 months based on current hospital endoscopy availability. Please refer to [facility], colorectal surgery Recall colonoscopy in 1 year for surveillance.

## 2022-08-25 NOTE — Telephone Encounter (Signed)
Spoke with the patient. Advised of recommendations. Patient agrees with this plan of care. Canceled the procedure at [facility].

## 2022-08-25 NOTE — Telephone Encounter (Signed)
Please advise. Thanks.  

## 2022-08-25 NOTE — Telephone Encounter (Signed)
Patient called stating he needs to reschedule his colonoscopy at [facility] for tomorrow. Please advise, thank you.

## 2022-08-26 ENCOUNTER — Encounter (HOSPITAL_COMMUNITY): Admission: RE | Payer: Self-pay | Source: Home / Self Care

## 2022-08-26 ENCOUNTER — Ambulatory Visit (HOSPITAL_COMMUNITY): Admission: RE | Admit: 2022-08-26 | Payer: Medicaid Other | Source: Home / Self Care | Admitting: Gastroenterology

## 2022-08-26 SURGERY — COLONOSCOPY WITH PROPOFOL
Anesthesia: Monitor Anesthesia Care

## 2022-09-02 NOTE — Telephone Encounter (Signed)
Patient called requesting a phone call back to have an update on previous messages. Please advise, thank you.

## 2022-09-07 NOTE — Telephone Encounter (Signed)
Lm on vm for patient to return call 

## 2022-09-14 NOTE — Telephone Encounter (Signed)
Patient is scheduled with Dr Derrell Lolling at [facility] for 09/27/22. I have spoken to patient and he states that he has been made aware of this appointment recently and has no additional questions. Recall colonoscopy is in EPIC for 2025 as well.

## 2022-10-11 DIAGNOSIS — K6389 Other specified diseases of intestine: Secondary | ICD-10-CM | POA: Diagnosis not present

## 2022-10-27 ENCOUNTER — Other Ambulatory Visit (HOSPITAL_COMMUNITY): Payer: 59

## 2022-10-27 ENCOUNTER — Emergency Department (HOSPITAL_COMMUNITY): Admission: EM | Admit: 2022-10-27 | Payer: 59 | Source: Home / Self Care

## 2022-10-27 NOTE — Progress Notes (Signed)
Sent message, via epic in basket, requesting orders in epic from surgeon.  

## 2022-10-29 NOTE — Patient Instructions (Addendum)
SURGICAL WAITING ROOM VISITATION  Patients having surgery or a procedure may have no more than 2 support people in the waiting area - these visitors may rotate.    Children under the age of 43 must have an adult with them who is not the patient.  Due to an increase in RSV and influenza rates and associated hospitalizations, children ages 39 and under may not visit patients in [facility] hospitals.  If the patient needs to stay at the hospital during part of their recovery, the visitor guidelines for inpatient rooms apply. Pre-op nurse will coordinate an appropriate time for 1 support person to accompany patient in pre-op.  This support person may not rotate.    Please refer to the [facility] website for the visitor guidelines for Inpatients (after your surgery is over and you are in a regular room).       Your procedure is scheduled on:  11/11/22    Report to [facility] Main Entrance    Report to admitting at  0930 AM   Call this number if you have problems the morning of surgery [phone]   Clear liquid diet on day of bowel prep.     After Midnight you may have the following liquids until _ 0830_____ AM  DAY OF SURGERY  Water Non-Citrus Juices (without pulp, NO RED-Apple, White grape, White cranberry) Black Coffee (NO MILK/CREAM OR CREAMERS, sugar ok)  Clear Tea (NO MILK/CREAM OR CREAMERS, sugar ok) regular and decaf                             Plain Jell-O (NO RED)                                           Fruit ices (not with fruit pulp, NO RED)                                     Popsicles (NO RED)                                                               Sports drinks like Gatorade (NO RED)              Drink 2 Ensure/G2 drinks AT 10:00 PM the night before surgery.        The day of surgery:  Drink ONE (1) Pre-Surgery Clear Ensure or G2 at  0830 AM  ( have completed by ) the morning of surgery. Drink in one sitting. Do not sip.  This drink was given to  you during your hospital  pre-op appointment visit. Nothing else to drink after completing the  Pre-Surgery Clear Ensure or G2.          If you have questions, please contact your surgeon's office.   FOLLOW BOWEL PREP AND ANY ADDITIONAL PRE OP INSTRUCTIONS YOU RECEIVED FROM YOUR SURGEON'S OFFICE!!!     Oral Hygiene is also important to reduce your risk of infection.  Remember - BRUSH YOUR TEETH THE MORNING OF SURGERY WITH YOUR REGULAR TOOTHPASTE  DENTURES WILL BE REMOVED PRIOR TO SURGERY PLEASE DO NOT APPLY "Poly grip" OR ADHESIVES!!!   Do NOT smoke after Midnight   Take these medicines the morning of surgery with A SIP OF WATER:  amlodipine, keppra   DO NOT TAKE ANY ORAL DIABETIC MEDICATIONS DAY OF YOUR SURGERY  Bring CPAP mask and tubing day of surgery.                              You may not have any metal on your body including hair pins, jewelry, and body piercing             Do not wear make-up, lotions, powders, perfumes/cologne, or deodorant  Do not wear nail polish including gel and S&S, artificial/acrylic nails, or any other type of covering on natural nails including finger and toenails. If you have artificial nails, gel coating, etc. that needs to be removed by a nail salon please have this removed prior to surgery or surgery may need to be canceled/ delayed if the surgeon/ anesthesia feels like they are unable to be safely monitored.   Do not shave  48 hours prior to surgery.               Men may shave face and neck.   Do not bring valuables to the hospital. [facility] IS NOT             RESPONSIBLE   FOR VALUABLES.   Contacts, glasses, dentures or bridgework may not be worn into surgery.   Bring small overnight bag day of surgery.   DO NOT BRING YOUR HOME MEDICATIONS TO THE HOSPITAL. PHARMACY WILL DISPENSE MEDICATIONS LISTED ON YOUR MEDICATION LIST TO YOU DURING YOUR ADMISSION IN THE HOSPITAL!    Patients discharged on the  day of surgery will not be allowed to drive home.  Someone NEEDS to stay with you for the first 24 hours after anesthesia.   Special Instructions: Bring a copy of your healthcare power of attorney and living will documents the day of surgery if you haven't scanned them before.              Please read over the following fact sheets you were given: IF YOU HAVE QUESTIONS ABOUT YOUR PRE-OP INSTRUCTIONS PLEASE CALL [phone]   If you received a COVID test during your pre-op visit  it is requested that you wear a mask when out in public, stay away from anyone that may not be feeling well and notify your surgeon if you develop symptoms. If you test positive for Covid or have been in contact with anyone that has tested positive in the last 10 days please notify you surgeon.    [facility] - Preparing for Surgery Before surgery, you can play an important role.  Because skin is not sterile, your skin needs to be as free of germs as possible.  You can reduce the number of germs on your skin by washing with CHG (chlorahexidine gluconate) soap before surgery.  CHG is an antiseptic cleaner which kills germs and bonds with the skin to continue killing germs even after washing. Please DO NOT use if you have an allergy to CHG or antibacterial soaps.  If your skin becomes reddened/irritated stop using the CHG and inform your nurse when you arrive at Short Stay. Do not shave (including legs and underarms)  for at least 48 hours prior to the first CHG shower.  You may shave your face/neck. Please follow these instructions carefully:  1.  Shower with CHG Soap the night before surgery and the  morning of Surgery.  2.  If you choose to wash your hair, wash your hair first as usual with your  normal  shampoo.  3.  After you shampoo, rinse your hair and body thoroughly to remove the  shampoo.                           4.  Use CHG as you would any other liquid soap.  You can apply chg directly  to the skin and wash                        Gently with a scrungie or clean washcloth.  5.  Apply the CHG Soap to your body ONLY FROM THE NECK DOWN.   Do not use on face/ open                           Wound or open sores. Avoid contact with eyes, ears mouth and genitals (private parts).                       Wash face,  Genitals (private parts) with your normal soap.             6.  Wash thoroughly, paying special attention to the area where your surgery  will be performed.  7.  Thoroughly rinse your body with warm water from the neck down.  8.  DO NOT shower/wash with your normal soap after using and rinsing off  the CHG Soap.                9.  Pat yourself dry with a clean towel.            10.  Wear clean pajamas.            11.  Place clean sheets on your bed the night of your first shower and do not  sleep with pets. Day of Surgery : Do not apply any lotions/deodorants the morning of surgery.  Please wear clean clothes to the hospital/surgery center.  FAILURE TO FOLLOW THESE INSTRUCTIONS MAY RESULT IN THE CANCELLATION OF YOUR SURGERY PATIENT SIGNATURE_________________________________  NURSE SIGNATURE__________________________________  ________________________________________________________________________

## 2022-10-29 NOTE — Progress Notes (Addendum)
Anesthesia Review:  PCP: Michael Oneill with [facility]  Cardiologist : none  Chest x-ray : EKG : 11/03/22  Echo : Stress test: Cardiac Cath :  Activity level: can do a flight of stairs without difficuty  Sleep Study/ CPAP : none  Fasting Blood Sugar :      / Checks Blood Sugar -- times a day:   Blood Thinner/ Instructions /Last Dose: ASA / Instructions/ Last Dose :    PT reports at preop appt he has bowel prep instructions.

## 2022-11-01 ENCOUNTER — Ambulatory Visit: Payer: Self-pay | Admitting: Surgery

## 2022-11-01 DIAGNOSIS — Z01818 Encounter for other preprocedural examination: Secondary | ICD-10-CM

## 2022-11-03 ENCOUNTER — Encounter (HOSPITAL_COMMUNITY)
Admission: RE | Admit: 2022-11-03 | Discharge: 2022-11-03 | Disposition: A | Discharge status: Home / Self Care | Payer: 59 | Source: Ambulatory Visit | Attending: Surgery | Admitting: Surgery

## 2022-11-03 ENCOUNTER — Other Ambulatory Visit: Payer: Self-pay

## 2022-11-03 ENCOUNTER — Encounter (HOSPITAL_COMMUNITY): Payer: Self-pay

## 2022-11-03 DIAGNOSIS — Z01818 Encounter for other preprocedural examination: Secondary | ICD-10-CM | POA: Insufficient documentation

## 2022-11-03 DIAGNOSIS — R9431 Abnormal electrocardiogram [ECG] [EKG]: Secondary | ICD-10-CM | POA: Insufficient documentation

## 2022-11-03 LAB — COMPREHENSIVE METABOLIC PANEL
ALT: 26 U/L (ref 0–44)
AST: 58 U/L — ABNORMAL HIGH (ref 15–41)
Albumin: 4.3 g/dL (ref 3.5–5.0)
Alkaline Phosphatase: 74 U/L (ref 38–126)
Anion gap: 10 (ref 5–15)
BUN: 8 mg/dL (ref 6–20)
CO2: 23 mmol/L (ref 22–32)
Calcium: 9.2 mg/dL (ref 8.9–10.3)
Chloride: 103 mmol/L (ref 98–111)
Creatinine, Ser: 0.88 mg/dL (ref 0.61–1.24)
GFR, Estimated: >60 mL/min (ref >60)
Glucose, Bld: 98 mg/dL (ref 70–99)
Potassium: 4.1 mmol/L (ref 3.5–5.1)
Sodium: 136 mmol/L (ref 135–145)
Total Bilirubin: 0.9 mg/dL (ref 0.3–1.2)
Total Protein: 7.6 g/dL (ref 6.5–8.1)

## 2022-11-03 LAB — CBC WITH DIFFERENTIAL/PLATELET
Abs Immature Granulocytes: 0.01 10*3/uL (ref 0.00–0.07)
Basophils Absolute: 0.1 10*3/uL (ref 0.0–0.1)
Basophils Relative: 1 %
Eosinophils Absolute: 0.1 10*3/uL (ref 0.0–0.5)
Eosinophils Relative: 2 %
HCT: 41 % (ref 39.0–52.0)
Hemoglobin: 14.1 g/dL (ref 13.0–17.0)
Immature Granulocytes: 0 %
Lymphocytes Relative: 34 %
Lymphs Abs: 1.7 10*3/uL (ref 0.7–4.0)
MCH: 33.3 pg (ref 26.0–34.0)
MCHC: 34.4 g/dL (ref 30.0–36.0)
MCV: 96.9 fL (ref 80.0–100.0)
Monocytes Absolute: 0.6 10*3/uL (ref 0.1–1.0)
Monocytes Relative: 13 %
Neutro Abs: 2.5 10*3/uL (ref 1.7–7.7)
Neutrophils Relative %: 50 %
Platelets: 269 10*3/uL (ref 150–400)
RBC: 4.23 MIL/uL (ref 4.22–5.81)
RDW: 13.6 % (ref 11.5–15.5)
WBC: 4.9 10*3/uL (ref 4.0–10.5)
nRBC: 0 % (ref 0.0–0.2)

## 2022-11-11 ENCOUNTER — Encounter (HOSPITAL_COMMUNITY): Payer: Self-pay | Admitting: Surgery

## 2022-11-11 ENCOUNTER — Other Ambulatory Visit: Payer: Self-pay

## 2022-11-11 ENCOUNTER — Inpatient Hospital Stay (HOSPITAL_COMMUNITY): Payer: 59 | Admitting: Physician Assistant

## 2022-11-11 ENCOUNTER — Inpatient Hospital Stay (HOSPITAL_COMMUNITY): Payer: 59 | Admitting: Anesthesiology

## 2022-11-11 ENCOUNTER — Inpatient Hospital Stay (HOSPITAL_COMMUNITY)
Admission: RE | Admit: 2022-11-11 | Discharge: 2022-11-14 | DRG: 331 | Disposition: A | Discharge status: Home / Self Care | Payer: 59 | Attending: Surgery | Admitting: Surgery

## 2022-11-11 ENCOUNTER — Encounter (HOSPITAL_COMMUNITY)
Admission: RE | Disposition: A | Discharge status: Home / Self Care | Payer: Self-pay | Source: Home / Self Care | Attending: Surgery

## 2022-11-11 DIAGNOSIS — F17210 Nicotine dependence, cigarettes, uncomplicated: Secondary | ICD-10-CM

## 2022-11-11 DIAGNOSIS — D123 Benign neoplasm of transverse colon: Secondary | ICD-10-CM | POA: Diagnosis not present

## 2022-11-11 DIAGNOSIS — Z87820 Personal history of traumatic brain injury: Secondary | ICD-10-CM

## 2022-11-11 DIAGNOSIS — I10 Essential (primary) hypertension: Secondary | ICD-10-CM | POA: Diagnosis present

## 2022-11-11 DIAGNOSIS — F32A Depression, unspecified: Secondary | ICD-10-CM | POA: Diagnosis not present

## 2022-11-11 DIAGNOSIS — R569 Unspecified convulsions: Secondary | ICD-10-CM | POA: Diagnosis present

## 2022-11-11 DIAGNOSIS — R1110 Vomiting, unspecified: Secondary | ICD-10-CM | POA: Diagnosis present

## 2022-11-11 DIAGNOSIS — K5730 Diverticulosis of large intestine without perforation or abscess without bleeding: Secondary | ICD-10-CM | POA: Diagnosis present

## 2022-11-11 DIAGNOSIS — K6389 Other specified diseases of intestine: Secondary | ICD-10-CM | POA: Diagnosis not present

## 2022-11-11 DIAGNOSIS — K639 Disease of intestine, unspecified: Secondary | ICD-10-CM | POA: Diagnosis not present

## 2022-11-11 DIAGNOSIS — Z8249 Family history of ischemic heart disease and other diseases of the circulatory system: Secondary | ICD-10-CM

## 2022-11-11 DIAGNOSIS — Z8719 Personal history of other diseases of the digestive system: Secondary | ICD-10-CM

## 2022-11-11 DIAGNOSIS — Z01818 Encounter for other preprocedural examination: Secondary | ICD-10-CM

## 2022-11-11 DIAGNOSIS — E785 Hyperlipidemia, unspecified: Secondary | ICD-10-CM | POA: Diagnosis not present

## 2022-11-11 DIAGNOSIS — E782 Mixed hyperlipidemia: Secondary | ICD-10-CM | POA: Diagnosis present

## 2022-11-11 DIAGNOSIS — Z560 Unemployment, unspecified: Secondary | ICD-10-CM

## 2022-11-11 DIAGNOSIS — D374 Neoplasm of uncertain behavior of colon: Secondary | ICD-10-CM | POA: Diagnosis not present

## 2022-11-11 DIAGNOSIS — Z9049 Acquired absence of other specified parts of digestive tract: Principal | ICD-10-CM

## 2022-11-11 HISTORY — PX: LAPAROSCOPIC RIGHT HEMI COLECTOMY: SHX5926

## 2022-11-11 LAB — TYPE AND SCREEN
ABO/RH(D): O POS
Antibody Screen: NEGATIVE

## 2022-11-11 LAB — ABO/RH: ABO/RH(D): O POS

## 2022-11-11 SURGERY — LAPAROSCOPIC RIGHT HEMI COLECTOMY
Anesthesia: General

## 2022-11-11 MED ORDER — MIDAZOLAM HCL 2 MG/2ML IJ SOLN
INTRAMUSCULAR | Status: DC | PRN
  Administered 2022-11-11: 2 mg via INTRAVENOUS

## 2022-11-11 MED ORDER — ALVIMOPAN 12 MG PO CAPS: 12.0000 mg | ORAL_CAPSULE | Freq: Two times a day (BID) | ORAL | Status: DC

## 2022-11-11 MED ORDER — HEPARIN SODIUM (PORCINE) 5000 UNIT/ML IJ SOLN
5000.0000 [IU] | Freq: Three times a day (TID) | INTRAMUSCULAR | Status: DC
  Administered 2022-11-11 – 2022-11-14 (×8): 5000 [IU] via SUBCUTANEOUS
  Filled 2022-11-11 (×8): qty 1

## 2022-11-11 MED ORDER — KETAMINE HCL 10 MG/ML IJ SOLN
INTRAMUSCULAR | Status: DC | PRN
  Administered 2022-11-11: 20 mg via INTRAVENOUS
  Administered 2022-11-11: 30 mg via INTRAVENOUS

## 2022-11-11 MED ORDER — KETAMINE HCL 50 MG/5ML IJ SOSY
PREFILLED_SYRINGE | INTRAMUSCULAR | Status: AC
  Filled 2022-11-11: qty 5

## 2022-11-11 MED ORDER — ACETAMINOPHEN 500 MG PO TABS
1000.0000 mg | ORAL_TABLET | Freq: Four times a day (QID) | ORAL | Status: DC
  Administered 2022-11-11 – 2022-11-14 (×11): 1000 mg via ORAL
  Filled 2022-11-11 (×12): qty 2

## 2022-11-11 MED ORDER — AMLODIPINE BESYLATE 10 MG PO TABS
10.0000 mg | ORAL_TABLET | Freq: Every day | ORAL | Status: DC
  Administered 2022-11-12 – 2022-11-14 (×3): 10 mg via ORAL
  Filled 2022-11-11 (×3): qty 1

## 2022-11-11 MED ORDER — PHENYLEPHRINE 80 MCG/ML (10ML) SYRINGE FOR IV PUSH (FOR BLOOD PRESSURE SUPPORT)
PREFILLED_SYRINGE | INTRAVENOUS | Status: AC
  Filled 2022-11-11: qty 10

## 2022-11-11 MED ORDER — HEPARIN SODIUM (PORCINE) 5000 UNIT/ML IJ SOLN
5000.0000 [IU] | Freq: Once | INTRAMUSCULAR | Status: AC
  Administered 2022-11-11: 5000 [IU] via SUBCUTANEOUS
  Filled 2022-11-11: qty 1

## 2022-11-11 MED ORDER — LACTATED RINGERS IV SOLN: INTRAVENOUS | Status: DC

## 2022-11-11 MED ORDER — DEXAMETHASONE SODIUM PHOSPHATE 10 MG/ML IJ SOLN
INTRAMUSCULAR | Status: DC | PRN
  Administered 2022-11-11: 10 mg via INTRAVENOUS

## 2022-11-11 MED ORDER — LACTATED RINGERS IR SOLN
Status: DC | PRN
  Administered 2022-11-11: 1000 mL

## 2022-11-11 MED ORDER — PHENYLEPHRINE HCL-NACL 20-0.9 MG/250ML-% IV SOLN
INTRAVENOUS | Status: DC | PRN
  Administered 2022-11-11: 25 ug/min via INTRAVENOUS

## 2022-11-11 MED ORDER — BUPIVACAINE LIPOSOME 1.3 % IJ SUSP: 20.0000 mL | Freq: Once | INTRAMUSCULAR | Status: DC

## 2022-11-11 MED ORDER — PROPOFOL 10 MG/ML IV BOLUS
INTRAVENOUS | Status: AC
  Filled 2022-11-11: qty 20

## 2022-11-11 MED ORDER — DIPHENHYDRAMINE HCL 50 MG/ML IJ SOLN: 12.5000 mg | Freq: Four times a day (QID) | INTRAMUSCULAR | Status: DC | PRN

## 2022-11-11 MED ORDER — FENTANYL CITRATE (PF) 250 MCG/5ML IJ SOLN
INTRAMUSCULAR | Status: DC | PRN
  Administered 2022-11-11 (×3): 50 ug via INTRAVENOUS
  Administered 2022-11-11: 100 ug via INTRAVENOUS

## 2022-11-11 MED ORDER — SIMETHICONE 80 MG PO CHEW
40.0000 mg | CHEWABLE_TABLET | Freq: Four times a day (QID) | ORAL | Status: DC | PRN
  Filled 2022-11-11: qty 1

## 2022-11-11 MED ORDER — ORAL CARE MOUTH RINSE: 15.0000 mL | Freq: Once | OROMUCOSAL | Status: AC

## 2022-11-11 MED ORDER — EPHEDRINE SULFATE-NACL 50-0.9 MG/10ML-% IV SOSY
PREFILLED_SYRINGE | INTRAVENOUS | Status: DC | PRN
  Administered 2022-11-11: 5 mg via INTRAVENOUS

## 2022-11-11 MED ORDER — DEXAMETHASONE SODIUM PHOSPHATE 10 MG/ML IJ SOLN
INTRAMUSCULAR | Status: AC
  Filled 2022-11-11: qty 1

## 2022-11-11 MED ORDER — POLYETHYLENE GLYCOL 3350 17 GM/SCOOP PO POWD: 1.0000 | Freq: Once | ORAL | Status: DC

## 2022-11-11 MED ORDER — ONDANSETRON HCL 4 MG/2ML IJ SOLN
4.0000 mg | Freq: Four times a day (QID) | INTRAMUSCULAR | Status: DC | PRN
  Administered 2022-11-11: 4 mg via INTRAVENOUS
  Filled 2022-11-11: qty 2

## 2022-11-11 MED ORDER — ROCURONIUM BROMIDE 10 MG/ML (PF) SYRINGE
PREFILLED_SYRINGE | INTRAVENOUS | Status: AC
  Filled 2022-11-11: qty 10

## 2022-11-11 MED ORDER — CHLORHEXIDINE GLUCONATE 0.12 % MT SOLN
15.0000 mL | Freq: Once | OROMUCOSAL | Status: AC
  Administered 2022-11-11: 15 mL via OROMUCOSAL

## 2022-11-11 MED ORDER — SUGAMMADEX SODIUM 200 MG/2ML IV SOLN
INTRAVENOUS | Status: DC | PRN
  Administered 2022-11-11: 120 mg via INTRAVENOUS

## 2022-11-11 MED ORDER — HYDROMORPHONE HCL 1 MG/ML IJ SOLN
0.5000 mg | INTRAMUSCULAR | Status: DC | PRN
  Administered 2022-11-11 – 2022-11-12 (×2): 0.5 mg via INTRAVENOUS
  Filled 2022-11-11 (×2): qty 0.5

## 2022-11-11 MED ORDER — CHLORHEXIDINE GLUCONATE CLOTH 2 % EX PADS: 6.0000 | MEDICATED_PAD | Freq: Once | CUTANEOUS | Status: DC

## 2022-11-11 MED ORDER — ONDANSETRON HCL 4 MG PO TABS
4.0000 mg | ORAL_TABLET | Freq: Four times a day (QID) | ORAL | Status: DC | PRN
  Administered 2022-11-12: 4 mg via ORAL
  Filled 2022-11-11: qty 1

## 2022-11-11 MED ORDER — PHENYLEPHRINE 80 MCG/ML (10ML) SYRINGE FOR IV PUSH (FOR BLOOD PRESSURE SUPPORT)
PREFILLED_SYRINGE | INTRAVENOUS | Status: DC | PRN
  Administered 2022-11-11: 80 ug via INTRAVENOUS

## 2022-11-11 MED ORDER — LEVETIRACETAM 500 MG PO TABS
500.0000 mg | ORAL_TABLET | Freq: Two times a day (BID) | ORAL | Status: DC
  Administered 2022-11-11 – 2022-11-14 (×6): 500 mg via ORAL
  Filled 2022-11-11 (×6): qty 1

## 2022-11-11 MED ORDER — ENSURE PRE-SURGERY PO LIQD
592.0000 mL | Freq: Once | ORAL | Status: DC
  Filled 2022-11-11: qty 592

## 2022-11-11 MED ORDER — DIPHENHYDRAMINE HCL 12.5 MG/5ML PO ELIX: 12.5000 mg | ORAL_SOLUTION | Freq: Four times a day (QID) | ORAL | Status: DC | PRN

## 2022-11-11 MED ORDER — PROPOFOL 10 MG/ML IV BOLUS
INTRAVENOUS | Status: DC | PRN
  Administered 2022-11-11: 200 mg via INTRAVENOUS

## 2022-11-11 MED ORDER — ENSURE PRE-SURGERY PO LIQD
296.0000 mL | Freq: Once | ORAL | Status: DC
  Filled 2022-11-11: qty 296

## 2022-11-11 MED ORDER — TRAMADOL HCL 50 MG PO TABS
50.0000 mg | ORAL_TABLET | Freq: Four times a day (QID) | ORAL | Status: DC | PRN
  Administered 2022-11-11 – 2022-11-13 (×5): 50 mg via ORAL
  Filled 2022-11-11 (×6): qty 1

## 2022-11-11 MED ORDER — HYDRALAZINE HCL 20 MG/ML IJ SOLN: 10.0000 mg | INTRAMUSCULAR | Status: DC | PRN

## 2022-11-11 MED ORDER — BUPIVACAINE HCL (PF) 0.25 % IJ SOLN
INTRAMUSCULAR | Status: AC
  Filled 2022-11-11: qty 30

## 2022-11-11 MED ORDER — BISACODYL 5 MG PO TBEC: 20.0000 mg | DELAYED_RELEASE_TABLET | Freq: Once | ORAL | Status: DC

## 2022-11-11 MED ORDER — FENTANYL CITRATE PF 50 MCG/ML IJ SOSY
25.0000 ug | PREFILLED_SYRINGE | INTRAMUSCULAR | Status: DC | PRN
  Administered 2022-11-11: 25 ug via INTRAVENOUS

## 2022-11-11 MED ORDER — FENTANYL CITRATE (PF) 250 MCG/5ML IJ SOLN
INTRAMUSCULAR | Status: AC
  Filled 2022-11-11: qty 5

## 2022-11-11 MED ORDER — ACETAMINOPHEN 500 MG PO TABS
1000.0000 mg | ORAL_TABLET | ORAL | Status: AC
  Administered 2022-11-11: 1000 mg via ORAL
  Filled 2022-11-11: qty 2

## 2022-11-11 MED ORDER — OXYCODONE HCL 5 MG PO TABS: 5.0000 mg | ORAL_TABLET | Freq: Once | ORAL | Status: DC | PRN

## 2022-11-11 MED ORDER — ALVIMOPAN 12 MG PO CAPS
12.0000 mg | ORAL_CAPSULE | ORAL | Status: AC
  Administered 2022-11-11: 12 mg via ORAL
  Filled 2022-11-11: qty 1

## 2022-11-11 MED ORDER — OXYCODONE HCL 5 MG/5ML PO SOLN: 5.0000 mg | Freq: Once | ORAL | Status: DC | PRN

## 2022-11-11 MED ORDER — ONDANSETRON HCL 4 MG/2ML IJ SOLN
INTRAMUSCULAR | Status: AC
  Filled 2022-11-11: qty 2

## 2022-11-11 MED ORDER — ONDANSETRON HCL 4 MG/2ML IJ SOLN
INTRAMUSCULAR | Status: DC | PRN
Start: 2022-11-11 — End: 2022-11-11
  Administered 2022-11-11: 4 mg via INTRAVENOUS

## 2022-11-11 MED ORDER — SODIUM CHLORIDE 0.9 % IV SOLN
2.0000 g | INTRAVENOUS | Status: AC
  Administered 2022-11-11: 2 g via INTRAVENOUS
  Filled 2022-11-11: qty 2

## 2022-11-11 MED ORDER — ENSURE SURGERY PO LIQD
237.0000 mL | Freq: Two times a day (BID) | ORAL | Status: DC
  Administered 2022-11-12 – 2022-11-14 (×2): 237 mL via ORAL

## 2022-11-11 MED ORDER — IBUPROFEN 400 MG PO TABS
600.0000 mg | ORAL_TABLET | Freq: Four times a day (QID) | ORAL | Status: DC | PRN
  Filled 2022-11-11: qty 1

## 2022-11-11 MED ORDER — EPHEDRINE 5 MG/ML INJ
INTRAVENOUS | Status: AC
  Filled 2022-11-11: qty 5

## 2022-11-11 MED ORDER — MIDAZOLAM HCL 2 MG/2ML IJ SOLN
INTRAMUSCULAR | Status: AC
  Filled 2022-11-11: qty 2

## 2022-11-11 MED ORDER — ROCURONIUM BROMIDE 10 MG/ML (PF) SYRINGE
PREFILLED_SYRINGE | INTRAVENOUS | Status: DC | PRN
  Administered 2022-11-11: 50 mg via INTRAVENOUS
  Administered 2022-11-11 (×3): 20 mg via INTRAVENOUS
  Administered 2022-11-11: 10 mg via INTRAVENOUS

## 2022-11-11 MED ORDER — ALUM & MAG HYDROXIDE-SIMETH 200-200-20 MG/5ML PO SUSP: 30.0000 mL | Freq: Four times a day (QID) | ORAL | Status: DC | PRN

## 2022-11-11 MED ORDER — BUPIVACAINE LIPOSOME 1.3 % IJ SUSP
INTRAMUSCULAR | Status: DC | PRN
  Administered 2022-11-11: 50 mL

## 2022-11-11 MED ORDER — LIDOCAINE HCL (PF) 2 % IJ SOLN
INTRAMUSCULAR | Status: AC
  Filled 2022-11-11: qty 5

## 2022-11-11 MED ORDER — METRONIDAZOLE 500 MG PO TABS: 1000.0000 mg | ORAL_TABLET | ORAL | Status: DC

## 2022-11-11 MED ORDER — PROMETHAZINE HCL 25 MG/ML IJ SOLN
INTRAMUSCULAR | Status: AC
  Filled 2022-11-11: qty 1

## 2022-11-11 MED ORDER — GABAPENTIN 100 MG PO CAPS
300.0000 mg | ORAL_CAPSULE | Freq: Every day | ORAL | Status: DC
  Administered 2022-11-11 – 2022-11-13 (×3): 300 mg via ORAL
  Filled 2022-11-11 (×3): qty 3

## 2022-11-11 MED ORDER — 0.9 % SODIUM CHLORIDE (POUR BTL) OPTIME
TOPICAL | Status: DC | PRN
  Administered 2022-11-11: 1000 mL

## 2022-11-11 MED ORDER — PROMETHAZINE HCL 25 MG/ML IJ SOLN
6.2500 mg | INTRAMUSCULAR | Status: DC | PRN
  Administered 2022-11-11: 6.25 mg via INTRAVENOUS

## 2022-11-11 MED ORDER — LIDOCAINE 2% (20 MG/ML) 5 ML SYRINGE
INTRAMUSCULAR | Status: DC | PRN
  Administered 2022-11-11: 60 mg via INTRAVENOUS

## 2022-11-11 MED ORDER — NEOMYCIN SULFATE 500 MG PO TABS: 1000.0000 mg | ORAL_TABLET | ORAL | Status: DC

## 2022-11-11 MED ORDER — FENTANYL CITRATE PF 50 MCG/ML IJ SOSY
PREFILLED_SYRINGE | INTRAMUSCULAR | Status: AC
  Administered 2022-11-11: 25 ug via INTRAVENOUS
  Filled 2022-11-11: qty 1

## 2022-11-11 SURGICAL SUPPLY — 77 items
ADH SKN CLS APL DERMABOND .7 (GAUZE/BANDAGES/DRESSINGS)
APPLIER CLIP 5 13 M/L LIGAMAX5 (MISCELLANEOUS)
APPLIER CLIP ROT 10 11.4 M/L (STAPLE)
APR CLP MED LRG 11.4X10 (STAPLE)
APR CLP MED LRG 5 ANG JAW (MISCELLANEOUS)
BAG COUNTER SPONGE SURGICOUNT (BAG) IMPLANT
BAG SPNG CNTER NS LX DISP (BAG)
BLADE EXTENDED COATED 6.5IN (ELECTRODE) IMPLANT
CABLE HIGH FREQUENCY MONO STRZ (ELECTRODE) IMPLANT
CATH MUSHROOM 30FR (CATHETERS) IMPLANT
CELLS DAT CNTRL 66122 CELL SVR (MISCELLANEOUS) IMPLANT
CLIP APPLIE 5 13 M/L LIGAMAX5 (MISCELLANEOUS) IMPLANT
CLIP APPLIE ROT 10 11.4 M/L (STAPLE) IMPLANT
DERMABOND ADVANCED .7 DNX12 (GAUZE/BANDAGES/DRESSINGS) IMPLANT
DISSECTOR BLUNT TIP ENDO 5MM (MISCELLANEOUS) IMPLANT
DRAIN CHANNEL 19F RND (DRAIN) IMPLANT
DRAPE SURG IRRIG POUCH 19X23 (DRAPES) ×1 IMPLANT
DRSG OPSITE POSTOP 4X10 (GAUZE/BANDAGES/DRESSINGS) IMPLANT
DRSG OPSITE POSTOP 4X6 (GAUZE/BANDAGES/DRESSINGS) IMPLANT
DRSG OPSITE POSTOP 4X8 (GAUZE/BANDAGES/DRESSINGS) IMPLANT
ELECT REM PT RETURN 15FT ADLT (MISCELLANEOUS) ×1 IMPLANT
EVACUATOR SILICONE 100CC (DRAIN) IMPLANT
GAUZE SPONGE 4X4 12PLY STRL (GAUZE/BANDAGES/DRESSINGS) IMPLANT
GLOVE BIO SURGEON STRL SZ7.5 (GLOVE) ×2 IMPLANT
GLOVE INDICATOR 8.0 STRL GRN (GLOVE) ×2 IMPLANT
GOWN STRL REUS W/ TWL XL LVL3 (GOWN DISPOSABLE) ×4 IMPLANT
GOWN STRL REUS W/TWL XL LVL3 (GOWN DISPOSABLE) ×4
HOLDER FOLEY CATH W/STRAP (MISCELLANEOUS) ×1 IMPLANT
IRRIG SUCT STRYKERFLOW 2 WTIP (MISCELLANEOUS) ×1
IRRIGATION SUCT STRKRFLW 2 WTP (MISCELLANEOUS) ×1 IMPLANT
KIT TURNOVER KIT A (KITS) IMPLANT
LIGASURE IMPACT 36 18CM CVD LR (INSTRUMENTS) IMPLANT
NDL INSUFFLATION 14GA 120MM (NEEDLE) IMPLANT
NEEDLE INSUFFLATION 14GA 120MM (NEEDLE) IMPLANT
PACK COLON (CUSTOM PROCEDURE TRAY) ×1 IMPLANT
PAD POSITIONING PINK XL (MISCELLANEOUS) ×1 IMPLANT
PENCIL SMOKE EVACUATOR (MISCELLANEOUS) IMPLANT
RELOAD PROXIMATE 75MM BLUE (ENDOMECHANICALS) ×2 IMPLANT
RELOAD STAPLE 75 3.8 BLU REG (ENDOMECHANICALS) IMPLANT
RETRACTOR WND ALEXIS 18 MED (MISCELLANEOUS) IMPLANT
RTRCTR WOUND ALEXIS 18CM MED (MISCELLANEOUS)
SCISSORS LAP 5X35 DISP (ENDOMECHANICALS) ×1 IMPLANT
SEALER TISSUE G2 STRG ARTC 35C (ENDOMECHANICALS) ×1 IMPLANT
SET TUBE SMOKE EVAC HIGH FLOW (TUBING) ×1 IMPLANT
SLEEVE ADV FIXATION 5X100MM (TROCAR) ×2 IMPLANT
SPIKE FLUID TRANSFER (MISCELLANEOUS) ×1 IMPLANT
SPONGE DRAIN TRACH 4X4 STRL 2S (GAUZE/BANDAGES/DRESSINGS) IMPLANT
STAPLER GUN LINEAR PROX 60 (STAPLE) IMPLANT
STAPLER PROXIMATE 75MM BLUE (STAPLE) IMPLANT
STAPLER VISISTAT 35W (STAPLE) IMPLANT
SUT ETHILON 3 0 PS 1 (SUTURE) IMPLANT
SUT PDS AB 1 TP1 54 (SUTURE) IMPLANT
SUT PDS AB 1 TP1 96 (SUTURE) IMPLANT
SUT PROLENE 2 0 KS (SUTURE) ×1 IMPLANT
SUT PROLENE 2 0 SH DA (SUTURE) ×1 IMPLANT
SUT SILK 2 0 (SUTURE) ×1
SUT SILK 2 0 SH CR/8 (SUTURE) ×1 IMPLANT
SUT SILK 2-0 18XBRD TIE 12 (SUTURE) ×1 IMPLANT
SUT SILK 3 0 (SUTURE) ×1
SUT SILK 3 0 SH CR/8 (SUTURE) ×1 IMPLANT
SUT SILK 3-0 18XBRD TIE 12 (SUTURE) ×1 IMPLANT
SUT VIC AB 2-0 SH 27 (SUTURE)
SUT VIC AB 2-0 SH 27X BRD (SUTURE) IMPLANT
SUT VIC AB 3-0 SH 18 (SUTURE) IMPLANT
SUT VIC AB 3-0 SH 27 (SUTURE)
SUT VIC AB 3-0 SH 27X BRD (SUTURE) IMPLANT
SUT VICRYL 2 0 18 UND BR (SUTURE) ×1 IMPLANT
SUT VLOC 180 2-0 6IN GS21 (SUTURE) IMPLANT
SYS LAPSCP GELPORT 120MM (MISCELLANEOUS)
SYS WOUND ALEXIS 18CM MED (MISCELLANEOUS)
SYSTEM LAPSCP GELPORT 120MM (MISCELLANEOUS) IMPLANT
SYSTEM WOUND ALEXIS 18CM MED (MISCELLANEOUS) IMPLANT
TOWEL OR 17X26 10 PK STRL BLUE (TOWEL DISPOSABLE) IMPLANT
TOWEL OR NON WOVEN STRL DISP B (DISPOSABLE) ×1 IMPLANT
TRAY IRRIG W/60CC SYR STRL (SET/KITS/TRAYS/PACK) ×1 IMPLANT
TROCAR ADV FIXATION 5X100MM (TROCAR) ×1 IMPLANT
TROCAR BALLN 12MMX100 BLUNT (TROCAR) IMPLANT

## 2022-11-11 NOTE — Op Note (Signed)
PATIENT: Michael Oneill  47 y.o. male  Patient Care Team: Marcine Matar, MD as PCP - General (Internal Medicine)  PREOP DIAGNOSIS: Transverse colon mass  POSTOP DIAGNOSIS: Distal transverse colon mass  PROCEDURE:  Laparoscopic-assisted partial/segmental colectomy Takedown of splenic flexure  SURGEON: Stephanie Coup. Evin Loiseau, MD  ASSISTANT: Romie Levee, MD  ANESTHESIA: General endotracheal  EBL: 50 mL Total I/O In: 1000 [I.V.:900; IV Piggyback:100] Out: 275 [Urine:225; Blood:50]  DRAINS: None  SPECIMEN: Distal transverse colon/splenic flexure - stitch distal (tattoo area)  COUNTS: Sponge, needle and instrument counts were reported correct x2  FINDINGS:  Tattoo in distal most transverse colon juxta-splenic flexure, distal to mass as expected based on endoscopic photos. Mobile polypoid mass in colon proximal to tattoo.  Segmental colectomy carried out after mobilizing splenic flexure. No evidence of metastatic disease on visceral parietal peritoneum or liver.   NARRATIVE:  The patient was identified & brought into the operating room, placed supine on the operating table and SCDs were applied to the lower extremities. General endotracheal anesthesia was induced. The patient was positioned supine with left arm tucked. Antibiotics were administered. A foley catheter was placed under sterile conditions. Hair in the region of planned surgery was clipped. The abdomen was prepped and draped in a sterile fashion. A timeout was performed confirming our patient and plan.   Beginning with the extraction port, a supraumbilical incision was made and carried down to the midline fascia. This was then incised with electrocautery. The peritoneum was identified and elevated between clamps and carefully opened sharply. A small Alexis wound protector with a cap and associated port was then placed. The abdomen was insufflated to 15 mmHg with Co2. A laparoscope was placed and camera inspection  revealed no evidence of injury. Bilateral TAP blocks were then performed under laparoscopic visualization using a mixture of 0.25% marcaine with epinepherine + Exparel.  The abdomen is surveyed.  There are some omental containing adhesions in the left upper quadrant as well as adhesions of his distal transverse colon.  There is evident tattoo at this location.  It was clear at this point that this was in the more distalmost aspect of the transverse colon.  Therefore, we opted to proceed with a planned segmental colectomy of the left side as opposed to doing a right hemicolectomy.    3 additional ports were then placed under direct laparoscopic visualization - one in the left hemiabdomen and two in the right abdomen. The abdomen was surveyed. The liver and peritoneum appeared normal.  There were no signs of metastatic disease.  We began by freeing adhesions of the omentum and distalmost transverse colon sharply from the abdominal wall.  These were related to his prior tattoo it would appear.  He was then positioned in reverse Trendelenburg with some right side down.  We began by elevating the omentum and reflecting the transverse colon at the midportion caudad.  The omentum was freed from its attachments to the transverse colon and the lesser sac was gained.  This was then fully mobilized all the way over to the splenic flexure.  The proximal portions of the splenic flexure also mobilized in this approach reflecting the splenic flexure from its attachments to the splenocolic ligament and beginnings of its attachments to Gerota's.  We then mobilized the descending colon by retracting it medially and incising the Dyllan Kats line of Toldt beginning in the left lower quadrant and working up into the left upper quadrant.  The associated mesocolon was also reflected medially.  The splenic flexure was then fully mobilized from this approach.  In doing so, the splenic flexure now reaches into the mid abdomen without any  difficulty.  Attention is then turned to the extracorporeal portion of the procedure.  Pneumoperitoneum was released and the tattooed portion of the transverse colon is eviscerated.  The lesion is palpable about 5 cm proximal to the tattoo.  The tattoo was at the level of the splenic flexure if not just beyond it.  A window was created mesentery at this level and the colon is divided using a GIA 75 mm blue load stapler.  The staple is inspected noted to have well-formed staples with a good seal.  Attention is then directed at the proximal point of transection which is on the mid to distal portion of the transverse colon.  Wound was created mesentery at this level and it is also divided similarly using a 75 mm GIA blue load stapler.  The main pedicle of the middle colic vessels remains in situ and is well proximal to our staple line.  The intervening mesentery between the staple lines is ligated and divided using the laparoscopic Enseal device.  The cut edge of the mesentery is inspected and noted to be completely hemostatic.  A stitch is placed on the distal staple line of the specimen which is also the location where the tattoo is.  There is approximately 5 cm on either side of the lesion as well.  The specimen is passed off.  There is a palpable pulse in the mesentery going out to the proximal and distal staple lines respectively.  The colon is well-perfused in each location.  The descending colon is well beyond midline and reaches over to the proximal staple line without any trouble.  There is more than adequate reach.  Attention is then directed to creating the anastomosis.  Orientation is confirmed such there is no twisting of either limb of colon.  We are able to bring together the antimesenteric side of each limb of colon without any tension.  Colotomies were created on each respective side and a 75 mm GIA blue load stapler was used to fashion a colocolonic anastomosis along the antimesenteric tinea.  The  mesentery is ensured to not be included in the staple line.  The stapler was then closed, held, and fired.  The anastomotic staple line is inspected and noted to be completely hemostatic with well-formed staples with a good seal.  The common colotomy is then closed using two, 2-0 V-Loc sutures in a Connell manner.  The closure was then meticulously inspected and noted to be completely closed.  The anastomosis is palpated and found to be at least 4 fingerbreadths in diameter, widely patent.  Omentum is also brought down over this anastomosis and closure site and gently pexied in place. This was then placed back into the abdomen. The abdomen was then irrigated with sterile saline and hemostasis verified. The wound protector cap was replaced and CO2 reinsufflated. The laparoscopic ports were removed under direct visualization and the sites noted to be hemostatic. The Alexis wound protector was removed, counts were reported correct, and we switched to clean instruments, gowns and drapes.   The fascia was then closed using two running #1 PDS sutures.  The skin of all incision sites was closed with 4-0 monocryl subcuticular suture. Dermabond was placed on the port sites and a sterile dressing was placed over the abdominal incision. All counts were reported correct. He was then awakened from anesthesia,  extubated, and sent to the post anesthesia care unit in stable condition.   DISPOSITION: PACU in satisfactory condition

## 2022-11-11 NOTE — Anesthesia Procedure Notes (Signed)
Procedure Name: Intubation Date/Time: 11/11/2022 10:42 AM  Performed by: Florene Route, CRNAPre-anesthesia Checklist: Patient identified, Emergency Drugs available, Suction available and Patient being monitored Patient Re-evaluated:Patient Re-evaluated prior to induction Oxygen Delivery Method: Circle system utilized Preoxygenation: Pre-oxygenation with 100% oxygen Induction Type: IV induction Ventilation: Mask ventilation without difficulty and Oral airway inserted - appropriate to patient size Laryngoscope Size: Hyacinth Meeker and 3 Grade View: Grade I Tube type: Oral Tube size: 8.0 mm Number of attempts: 1 Airway Equipment and Method: Stylet and Oral airway Placement Confirmation: ETT inserted through vocal cords under direct vision, positive ETCO2 and breath sounds checked- equal and bilateral Secured at: 25 cm Tube secured with: Tape Dental Injury: Teeth and Oropharynx as per pre-operative assessment

## 2022-11-11 NOTE — Plan of Care (Signed)
  Problem: Activity: Goal: Ability to tolerate increased activity will improve Outcome: Progressing   Problem: Bowel/Gastric: Goal: Gastrointestinal status for postoperative course will improve Outcome: Progressing   Problem: Skin Integrity: Goal: Will show signs of wound healing Outcome: Progressing   Problem: Clinical Measurements: Goal: Will remain free from infection Outcome: Progressing   Problem: Activity: Goal: Risk for activity intolerance will decrease Outcome: Progressing

## 2022-11-11 NOTE — Anesthesia Postprocedure Evaluation (Signed)
Anesthesia Post Note  Patient: Michael Oneill  Procedure(s) Performed: LAPAROSCOPIC SEGMENTAL PARTIAL COLECTOMY; TAKEDOWN OF SPELNIC FLEXURE     Patient location during evaluation: PACU Anesthesia Type: General Level of consciousness: awake and alert Pain management: pain level controlled Vital Signs Assessment: post-procedure vital signs reviewed and stable Respiratory status: spontaneous breathing, nonlabored ventilation and respiratory function stable Cardiovascular status: stable and blood pressure returned to baseline Anesthetic complications: no   No notable events documented.  Last Vitals:  Vitals:   11/11/22 1345 11/11/22 1410  BP: 125/87 123/84  Pulse: 68 65  Resp: 14 15  Temp: 36.6 C 36.4 C  SpO2: 100% 100%    Last Pain:  Vitals:   11/11/22 1410  TempSrc: Oral  PainSc:                  [name]

## 2022-11-11 NOTE — H&P (Signed)
CC: Here today for surgery  HPI: Erick Trotta is an 47 y.o. male with history of HTN, seizure/TBI, whom is seen in the office today as a referral by Dr. 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  He underwent colonoscopy with Dr. Lavon Paganini 07/30/22 -  Two 8 to 11 mm polyps in ascending colon and cecum, removed Two 14 to 18 mm polyps in the sigmoid and hepatic flexure, removed Large polypoid mass in the transverse colon, biopsy, tattooed. Diverticulosis in the sigmoid and in the ascending colon Nonbleeding external and internal hemorrhoids.  PATH 1. Ascending colon sessile serrated polyp without dysplasia 2. Ascending colon polyp-tubular adenoma 3. Transverse colon mass biopsy-superficial fragments of tubulovillous adenoma with focal features approaching but not pathognomonic for high-grade dysplasia 4. Sigmoid colon polyp, tubular adenoma  He was subsequently referred for consideration of EMR. He was referred to see Dr. Leone Payor for this purpose. There were plans underway for this to be done somewhere around 09/08/2022. There were notes in the chart from Dr. Lavon Paganini from 08/25/2022 indicating that she felt it was in his best interest to be referred for surgery.  Endoscopically, this is a large polypoid lesion, by my estimation greater than 3 cm in size. Tattoo ink is distal to the lesion.  He has not had any CT but also no clear diagnosis of cancer as of yet.  He is today with his mother. He reports no symptoms at present including nausea, vomiting, weight changes, blood in his stool, abdominal pain or bloating.  He denies any changes in health or health history since we met in the office. No new medications/allergies. He states he is ready for surgery today. States he tolerated bowel prep with satisfactory result  PMH: HTN, Seizures/TBI  PSH: Right inguinal hernia repair as an infant and again in 2007. Denies any other abdominal or pelvic surgical  history.  FHx: Denies any known family history of colorectal, breast, endometrial or ovarian cancer  Social Hx: Reports he smokes approximately 5 cigarettes/day; social but not daily alcohol use. He is not currently working.   Past Medical History:  Diagnosis Date   Hypertension    Seizures (HCC)    TBI (traumatic brain injury) (HCC) 1997    Past Surgical History:  Procedure Laterality Date   BRAIN SURGERY     1997    Family History  Problem Relation Age of Onset   Heart failure Mother    Colon cancer Neg Hx    Esophageal cancer Neg Hx    Rectal cancer Neg Hx    Stomach cancer Neg Hx    Colon polyps Neg Hx     Social:  reports that he has been smoking cigarettes. He has never used smokeless tobacco. He reports current alcohol use. He reports that he does not use drugs.  Allergies: No Known Allergies  Medications: I have reviewed the patient's current medications.  No results found for this or any previous visit (from the past 48 hour(s)).  No results found.   PE Blood pressure (!) 146/91, pulse 65, temperature 98.4 F (36.9 C), temperature source Oral, resp. rate 16, height 5\' 9"  (1.753 m), weight 60.3 kg, SpO2 100%. Constitutional: NAD; conversant Eyes: Moist conjunctiva; no lid lag; anicteric Lungs: Normal respiratory effort CV: RRR GI: Abd soft, NT/ND Psychiatric: Appropriate affect  No results found for this or any previous visit (from the past 48 hour(s)).  No results found.  A/P: Acen Craun is an 47 y.o. 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 The pathophysiology of colon polyps and cancer was discussed as well with associated pictures. -We have discussed various different treatment options going forward  including surgery (the most definitive) to address this -laparoscopic assisted right hemicolectomy versus segmental colectomy based on lesion location. -The planned procedure, material risks (including, but not limited to, pain, bleeding, infection, scarring, need for blood transfusion, damage to surrounding structures- blood vessels/nerves/viscus/organs, damage to ureter, urine leak, leak from anastomosis, need for additional procedures, increased fecal urgency and/or frequency, scenarios where a stoma may be necessary and where it may be permanent, worsening of pre-existing medical conditions, chronic diarrhea, constipation secondary to narcotic use, hernia, recurrence, pneumonia, heart attack, stroke, death) benefits and alternatives to surgery were discussed at length. The patient's questions were answered to his satisfaction, he voiced understanding and elected to proceed with surgery. Additionally, we discussed typical postoperative expectations and the recovery process.   Marin Olp, MD [facility], [facility]

## 2022-11-11 NOTE — Transfer of Care (Signed)
Immediate Anesthesia Transfer of Care Note  Patient: Michael Oneill  Procedure(s) Performed: LAPAROSCOPIC SEGMENTAL PARTIAL COLECTOMY; TAKEDOWN OF SPELNIC FLEXURE  Patient Location: PACU  Anesthesia Type:General  Level of Consciousness: awake  Airway & Oxygen Therapy: Patient Spontanous Breathing and Patient connected to face mask oxygen  Post-op Assessment: Report given to RN and Post -op Vital signs reviewed and stable  Post vital signs: Reviewed and stable  Last Vitals:  Vitals Value Taken Time  BP 138/111 11/11/22 1255  Temp    Pulse 99 11/11/22 1255  Resp 16 11/11/22 1255  SpO2 100 % 11/11/22 1255  Vitals shown include unfiled device data.  Last Pain:  Vitals:   11/11/22 0944  TempSrc:   PainSc: 0-No pain         Complications: No notable events documented.

## 2022-11-11 NOTE — Anesthesia Preprocedure Evaluation (Addendum)
Anesthesia Evaluation  Patient identified by MRN, date of birth, ID band Patient awake    Reviewed: Allergy & Precautions, NPO status , Patient's Chart, lab work & pertinent test results  History of Anesthesia Complications Negative for: history of anesthetic complications  Airway Mallampati: II  TM Distance: >3 FB Neck ROM: Full    Dental  (+) Dental Advisory Given   Pulmonary Current SmokerPatient did not abstain from smoking.   Pulmonary exam normal        Cardiovascular hypertension, Pt. on medications Normal cardiovascular exam     Neuro/Psych Seizures -, Well Controlled,  PSYCHIATRIC DISORDERS  Depression     TBI   Neuromuscular disease    GI/Hepatic Neg liver ROS,,, Colon mass    Endo/Other  negative endocrine ROS    Renal/GU negative Renal ROS     Musculoskeletal negative musculoskeletal ROS (+)    Abdominal   Peds  Hematology negative hematology ROS (+)   Anesthesia Other Findings   Reproductive/Obstetrics                             Anesthesia Physical Anesthesia Plan  ASA: 3  Anesthesia Plan: General   Post-op Pain Management: Ofirmev IV (intra-op)* and Toradol IV (intra-op)*   Induction: Intravenous  PONV Risk Score and Plan: 1 and Treatment may vary due to age or medical condition, Ondansetron, Dexamethasone and Midazolam  Airway Management Planned: Oral ETT  Additional Equipment: None  Intra-op Plan:   Post-operative Plan: Extubation in OR  Informed Consent: I have reviewed the patients History and Physical, chart, labs and discussed the procedure including the risks, benefits and alternatives for the proposed anesthesia with the patient or authorized representative who has indicated his/her understanding and acceptance.     Dental advisory given  Plan Discussed with: CRNA and Anesthesiologist  Anesthesia Plan Comments:         Anesthesia  Quick Evaluation

## 2022-11-12 ENCOUNTER — Encounter (HOSPITAL_COMMUNITY): Payer: Self-pay | Admitting: Surgery

## 2022-11-12 LAB — CBC: MCHC: 34.8 g/dL (ref 30.0–36.0)

## 2022-11-12 LAB — BASIC METABOLIC PANEL: CO2: 25 mmol/L (ref 22–32)

## 2022-11-12 MED ORDER — LACTATED RINGERS IV SOLN: INTRAVENOUS | Status: DC

## 2022-11-12 MED ORDER — TRAMADOL HCL 50 MG PO TABS: 50.0000 mg | ORAL_TABLET | Freq: Four times a day (QID) | ORAL | 0 refills | Status: AC | PRN

## 2022-11-12 NOTE — Discharge Instructions (Signed)
POST OP INSTRUCTIONS AFTER COLON SURGERY  DIET: Be sure to include lots of fluids daily to stay hydrated - 64oz of water per day (8, 8 oz glasses).  Avoid fast food or heavy meals for the first couple of weeks as your are more likely to get nauseated. Avoid raw/uncooked fruits or vegetables for the first 4 weeks (its ok to have these if they are blended into smoothie form). If you have fruits/vegetables, make sure they are cooked until soft enough to mash on the roof of your mouth and chew your food well. Otherwise, diet as tolerated.  Take your usually prescribed home medications unless otherwise directed.  PAIN CONTROL: Pain is best controlled by a usual combination of three different methods TOGETHER: Ice/Heat Over the counter pain medication Prescription pain medication Most patients will experience some swelling and bruising around the surgical site.  Ice packs or heating pads (30-60 minutes up to 6 times a day) will help. Some people prefer to use ice alone, heat alone, alternating between ice & heat.  Experiment to what works for you.  Swelling and bruising can take several weeks to resolve.   It is helpful to take an over-the-counter pain medication regularly for the first few weeks: Ibuprofen (Motrin/Advil) - 200mg tabs - take 3 tabs (600mg) every 6 hours as needed for pain (unless you have been directed previously to avoid NSAIDs/ibuprofen) Acetaminophen (Tylenol) - you may take 650mg every 6 hours as needed. You can take this with motrin as they act differently on the body. If you are taking a narcotic pain medication that has acetaminophen in it, do not take over the counter tylenol at the same time. NOTE: You may take both of these medications together - most patients  find it most helpful when alternating between the two (i.e. Ibuprofen at 6am, tylenol at 9am, ibuprofen at 12pm ...) A  prescription for pain medication should be given to you upon discharge.  Take your pain medication as  prescribed if your pain is not adequatly controlled with the over-the-counter pain reliefs mentioned above.  Avoid getting constipated.  Between the surgery and the pain medications, it is common to experience some constipation.  Increasing fluid intake and taking a fiber supplement (such as Metamucil, Citrucel, FiberCon, MiraLax, etc) 1-2 times a day regularly will usually help prevent this problem from occurring.  A mild laxative (prune juice, Milk of Magnesia, MiraLax, etc) should be taken according to package directions if there are no bowel movements after 48 hours.    Dressing: Your incisions are covered in Dermabond which is like sterile superglue for the skin. This will come off on it's own in a couple weeks. It is waterproof and you may bathe normally starting the day after your surgery in a shower. Avoid baths/pools/lakes/oceans until your wounds have fully healed.  ACTIVITIES as tolerated:   Avoid heavy lifting (>10lbs or 1 gallon of milk) for the next 6 weeks. You may resume regular daily activities as tolerated--such as daily self-care, walking, climbing stairs--gradually increasing activities as tolerated.  If you can walk 30 minutes without difficulty, it is safe to try more intense activity such as jogging, treadmill, bicycling, low-impact aerobics.  DO NOT PUSH THROUGH PAIN.  Let pain be your guide: If it hurts to do something, don't do it. You may drive when you are no longer taking prescription pain medication, you can comfortably wear a seatbelt, and you can safely maneuver your car and apply brakes.  FOLLOW UP in our   office Please call [facility] at [phone] to set up an appointment to see your surgeon in the office for a follow-up appointment approximately 2 weeks after your surgery. Make sure that you call for this appointment the day you arrive home to insure a convenient appointment time.  9. If you have disability or family leave forms that need to be completed, you may have  them completed by your primary care physician's office; for return to work instructions, please ask our office staff and they will be happy to assist you in obtaining this documentation   When to call us [phone]: Poor pain control Reactions / problems with new medications (rash/itching, etc)  Fever over 101.5 F (38.5 C) Inability to urinate Nausea/vomiting Worsening swelling or bruising Continued bleeding from incision. Increased pain, redness, or drainage from the incision  The clinic staff is available to answer your questions during regular business hours (8:30am-5pm).  Please don't hesitate to call and ask to speak to one of our nurses for clinical concerns.   A surgeon from [facility] is always on call at the hospitals   If you have a medical emergency, go to the nearest emergency room or call 911.  [facility] [address], [address], [place], [place]  [address] MAIN: [phone] FAX: [phone] [email or web address]  

## 2022-11-12 NOTE — Progress Notes (Signed)
  Subjective No acute events. Feeling well. Emesis after surgery but none last night or overnight. Reports good pain control.  Objective: Vital signs in last 24 hours: Temp:  [97.5 F (36.4 C)-98.8 F (37.1 C)] 98.5 F (36.9 C) (07/26 0523) Pulse Rate:  [53-98] 53 (07/26 0523) Resp:  [13-19] 18 (07/26 0523) BP: (115-146)/(77-111) 135/79 (07/26 0523) SpO2:  [96 %-100 %] 100 % (07/26 0523) Weight:  [60.3 kg-67.2 kg] 67.2 kg (07/26 0500) Last BM Date : 11/11/22  Intake/Output from previous day: 07/25 0701 - 07/26 0700 In: 3230 [P.O.:1080; I.V.:2050; IV Piggyback:100] Out: 1275 [Urine:1075; Emesis/NG output:150; Blood:50] Intake/Output this shift: No intake/output data recorded.  Gen: NAD, comfortable CV: RRR Pulm: Normal work of breathing Abd: Soft, NT/ND; incisions c/d/I without erythema/drainage.  Ext: SCDs in place  Lab Results: CBC  Recent Labs    11/12/22 0420  WBC 7.7  HGB 12.0*  HCT 34.5*  PLT 197   BMET Recent Labs    11/12/22 0420  NA 132*  K 3.6  CL 96*  CO2 25  GLUCOSE 136*  BUN 6  CREATININE 0.93  CALCIUM 8.8*   PT/INR No results for input(s): "LABPROT", "INR" in the last 72 hours. ABG No results for input(s): "PHART", "HCO3" in the last 72 hours.  Invalid input(s): "PCO2", "PO2"  Studies/Results:  Anti-infectives: Anti-infectives (From admission, onward)    Start     Dose/Rate Route Frequency Ordered Stop   11/11/22 1400  neomycin (MYCIFRADIN) tablet 1,000 mg  Status:  Discontinued       Placed in "And" Linked Group   1,000 mg Oral 3 times per day 11/11/22 0906 11/11/22 0910   11/11/22 1400  metroNIDAZOLE (FLAGYL) tablet 1,000 mg  Status:  Discontinued       Placed in "And" Linked Group   1,000 mg Oral 3 times per day 11/11/22 0906 11/11/22 0910   11/11/22 0915  cefoTEtan (CEFOTAN) 2 g in sodium chloride 0.9 % 100 mL IVPB        2 g 200 mL/hr over 30 Minutes Intravenous On call to O.R. 11/11/22 0906 11/11/22 1400         Assessment/Plan: Patient Active Problem List   Diagnosis Date Noted   S/P partial colectomy 11/11/2022   Major depressive disorder, single episode, moderate (HCC) 05/07/2022   Mixed hyperlipidemia 04/30/2020   Influenza vaccine refused 04/28/2020   Essential hypertension 04/21/2018   Tobacco dependence 04/21/2018   Neuropathy 06/30/2015   Seizures (HCC) 06/03/2015   INGUINAL HERNIA 02/06/2007   HERNIORRHAPHY, HX OF 02/06/2007   s/p Procedure(s): LAPAROSCOPIC SEGMENTAL PARTIAL COLECTOMY; TAKEDOWN OF SPELNIC FLEXURE 11/11/2022  -Doing well -Diet as tolerated -D/C IVF -D/C Entereg -Ambulate 5x/day -Remove honeycomb before discharge -PPx: SQH, SCD -We spent time reviewing his procedure findings and plans.   LOS: 1 day   Marin Olp, MD [facility]

## 2022-11-12 NOTE — Progress Notes (Signed)
   11/12/22 1135  TOC Brief Assessment  Insurance and Status Reviewed  Patient has primary care physician Yes  Home environment has been reviewed Resides with parent  Prior level of function: Independent at baseline  Prior/Current Home Services No current home services  Social Determinants of Health Reivew SDOH reviewed no interventions necessary  Readmission risk has been reviewed Yes  Transition of care needs no transition of care needs at this time

## 2022-11-12 NOTE — Plan of Care (Signed)
  Problem: Education: Goal: Understanding of discharge needs will improve Outcome: Progressing   Problem: Activity: Goal: Ability to tolerate increased activity will improve Outcome: Progressing   Problem: Bowel/Gastric: Goal: Gastrointestinal status for postoperative course will improve Outcome: Progressing   Problem: Nutritional: Goal: Will attain and maintain optimal nutritional status will improve Outcome: Progressing   Problem: Clinical Measurements: Goal: Postoperative complications will be avoided or minimized Outcome: Progressing   Problem: Skin Integrity: Goal: Will show signs of wound healing Outcome: Progressing

## 2022-11-12 NOTE — Progress Notes (Signed)
Mobility Specialist - Progress Note   11/12/22 1501  Mobility  Activity Ambulated with assistance in hallway  Level of Assistance Modified independent, requires aide device or extra time  Assistive Device Front wheel walker  Distance Ambulated (ft) 450 ft  Range of Motion/Exercises Active  Activity Response Tolerated well  Mobility Referral Yes  $Mobility charge 1 Mobility  Mobility Specialist Start Time (ACUTE ONLY) 1450  Mobility Specialist Stop Time (ACUTE ONLY) 1501  Mobility Specialist Time Calculation (min) (ACUTE ONLY) 11 min   Pt was found in bed and agreeable to ambulate. No complaints with session and at EOS returned to bed with all needs met. Call bell in reach.  Billey Chang Mobility Specialist

## 2022-11-12 NOTE — Progress Notes (Signed)
Mobility Specialist - Progress Note   11/12/22 0956  Mobility  Activity Ambulated with assistance in hallway  Level of Assistance Standby assist, set-up cues, supervision of patient - no hands on  Assistive Device Front wheel walker  Distance Ambulated (ft) 500 ft  Range of Motion/Exercises Active  Activity Response Tolerated well  Mobility Referral Yes  $Mobility charge 1 Mobility  Mobility Specialist Start Time (ACUTE ONLY) 0945  Mobility Specialist Stop Time (ACUTE ONLY) 0956  Mobility Specialist Time Calculation (min) (ACUTE ONLY) 11 min   Pt was found in bed and agreeable to ambulate.  Stated feeling dizzy with ambulation but got better when returning to room and getting in bed. At EOS was left in bed with all needs met. Call bell in reach.  Billey Chang Mobility Specialist

## 2022-11-13 MED ORDER — POTASSIUM CHLORIDE 20 MEQ PO PACK
20.0000 meq | PACK | Freq: Two times a day (BID) | ORAL | Status: DC
  Administered 2022-11-13 – 2022-11-14 (×3): 20 meq via ORAL
  Filled 2022-11-13 (×3): qty 1

## 2022-11-13 NOTE — Progress Notes (Signed)
    2 Days Post-Op  Subjective: Pain controlled, ambulated yesterday, +BMs but runny, tolerating a diet  ROS: See above, otherwise other systems negative  Objective: Vital signs in last 24 hours: Temp:  [98.3 F (36.8 C)-98.9 F (37.2 C)] 98.7 F (37.1 C) (07/27 0516) Pulse Rate:  [60-64] 61 (07/27 0516) Resp:  [18] 18 (07/27 0516) BP: (113-124)/(72-84) 121/72 (07/27 0516) SpO2:  [96 %-100 %] 100 % (07/27 0516) Last BM Date : 11/12/22  Intake/Output from previous day: 07/26 0701 - 07/27 0700 In: 1660 [P.O.:1660] Out: 100 [Urine:100] Intake/Output this shift: No intake/output data recorded.  PE: Gen: NAD Resp: nonlabored CV: RRR Abd: incisions c/d/i  Lab Results:  Recent Labs    11/12/22 0420 11/13/22 0543  WBC 7.7 6.4  HGB 12.0* 11.5*  HCT 34.5* 33.5*  PLT 197 173   BMET Recent Labs    11/12/22 0420 11/13/22 0543  NA 132* 135  K 3.6 3.4*  CL 96* 99  CO2 25 26  GLUCOSE 136* 102*  BUN 6 <5*  CREATININE 0.93 0.78  CALCIUM 8.8* 9.0   PT/INR No results for input(s): "LABPROT", "INR" in the last 72 hours. CMP     Component Value Date/Time   NA 135 11/13/2022 0543   NA 141 12/11/2021 1645   K 3.4 (L) 11/13/2022 0543   CL 99 11/13/2022 0543   CO2 26 11/13/2022 0543   GLUCOSE 102 (H) 11/13/2022 0543   BUN <5 (L) 11/13/2022 0543   BUN 8 12/11/2021 1645   CREATININE 0.78 11/13/2022 0543   CALCIUM 9.0 11/13/2022 0543   PROT 7.6 11/03/2022 0907   PROT 7.8 05/07/2022 1048   ALBUMIN 4.3 11/03/2022 0907   ALBUMIN 5.0 05/07/2022 1048   AST 58 (H) 11/03/2022 0907   ALT 26 11/03/2022 0907   ALKPHOS 74 11/03/2022 0907   BILITOT 0.9 11/03/2022 0907   BILITOT 0.4 05/07/2022 1048   GFRNONAA >60 11/13/2022 0543   GFRAA 121 04/28/2020 1642   Lipase  No results found for: "LIPASE"  Studies/Results: No results found.  Anti-infectives: Anti-infectives (From admission, onward)    Start     Dose/Rate Route Frequency Ordered Stop   11/11/22 1400   neomycin (MYCIFRADIN) tablet 1,000 mg  Status:  Discontinued       Placed in "And" Linked Group   1,000 mg Oral 3 times per day 11/11/22 0906 11/11/22 0910   11/11/22 1400  metroNIDAZOLE (FLAGYL) tablet 1,000 mg  Status:  Discontinued       Placed in "And" Linked Group   1,000 mg Oral 3 times per day 11/11/22 0906 11/11/22 0910   11/11/22 0915  cefoTEtan (CEFOTAN) 2 g in sodium chloride 0.9 % 100 mL IVPB        2 g 200 mL/hr over 30 Minutes Intravenous On call to O.R. 11/11/22 0906 11/11/22 1400       Assessment/Plan POD 2 lap assisted transverse colectomy  FEN - reg diet VTE - lovenox ID - no issues Dispo - home soon  I reviewed last 24 h vitals and pain scores, last 48 h intake and output, last 24 h labs and trends, and last 24 h imaging results.  This care required moderate level of medical decision making.    LOS: 2 days   [facility] 11/13/2022, 9:17 AM Please see Amion for pager number during day hours 7:00am-4:30pm or 7:00am -11:30am on weekends

## 2022-11-13 NOTE — Plan of Care (Signed)
  Problem: Education: Goal: Understanding of discharge needs will improve Outcome: Progressing   Problem: Activity: Goal: Ability to tolerate increased activity will improve Outcome: Progressing   Problem: Bowel/Gastric: Goal: Gastrointestinal status for postoperative course will improve Outcome: Progressing   Problem: Nutritional: Goal: Will attain and maintain optimal nutritional status will improve Outcome: Progressing   Problem: Clinical Measurements: Goal: Postoperative complications will be avoided or minimized Outcome: Progressing   Problem: Skin Integrity: Goal: Will show signs of wound healing Outcome: Progressing

## 2022-11-14 LAB — BASIC METABOLIC PANEL WITH GFR
Anion gap: 9 (ref 5–15)
BUN: 5 mg/dL — ABNORMAL LOW (ref 6–20)
CO2: 23 mmol/L (ref 22–32)
Calcium: 9.1 mg/dL (ref 8.9–10.3)
Chloride: 97 mmol/L — ABNORMAL LOW (ref 98–111)
Creatinine, Ser: 0.9 mg/dL (ref 0.61–1.24)
GFR, Estimated: >60 mL/min (ref >60)
Glucose, Bld: 96 mg/dL (ref 70–99)
Potassium: 3.9 mmol/L (ref 3.5–5.1)
Sodium: 129 mmol/L — ABNORMAL LOW (ref 135–145)

## 2022-11-14 LAB — CBC
HCT: 36.9 % — ABNORMAL LOW (ref 39.0–52.0)
Hemoglobin: 12.8 g/dL — ABNORMAL LOW (ref 13.0–17.0)
MCH: 33.6 pg (ref 26.0–34.0)
MCHC: 34.7 g/dL (ref 30.0–36.0)
MCV: 96.9 fL (ref 80.0–100.0)
Platelets: 198 10*3/uL (ref 150–400)
RBC: 3.81 MIL/uL — ABNORMAL LOW (ref 4.22–5.81)
RDW: 12.8 % (ref 11.5–15.5)
WBC: 6.2 10*3/uL (ref 4.0–10.5)
nRBC: 0 % (ref 0.0–0.2)

## 2022-11-14 NOTE — Progress Notes (Signed)
AVS reviewed with Pt.  All questions answered.  All belongings taken with Pt.  Pt stable at time of discharge.

## 2022-11-14 NOTE — Discharge Summary (Signed)
Physician Discharge Summary  Michael Oneill WGN:[id number] DOB: 04/27/1975 DOA: 11/11/2022  PCP: Marcine Matar, MD  Admit date: 11/11/2022 Discharge date:  11/14/2022   Recommendations for Outpatient Follow-up:   (include homehealth, outpatient follow-up instructions, specific recommendations for PCP to follow-up on, etc.)   Follow-up Information     Andria Meuse, MD Follow up on 11/29/2022.   Specialties: General Surgery, Colon and Rectal Surgery Why: Please arrive by 1:15 pm Contact information: [address] [place] [address] [phone]                Discharge Diagnoses:  Principal Problem:   S/P partial colectomy   Surgical Procedure: laparoscopic partial colectomy  Discharge Condition: Good Disposition: Home  Diet recommendation: reg diet   Hospital Course:  47 yo male underwent transverse colon mass resection. Post op he was admitted to the surgical floor. He had pain issues but that improved. He was discharged home POD 3.  Discharge Instructions  Discharge Instructions     Call MD for:  persistant nausea and vomiting   Complete by: As directed    Call MD for:  redness, tenderness, or signs of infection (pain, swelling, redness, odor or green/yellow discharge around incision site)   Complete by: As directed    Call MD for:  severe uncontrolled pain   Complete by: As directed    Call MD for:  temperature >100.4   Complete by: As directed    Diet - low sodium heart healthy   Complete by: As directed    Increase activity slowly   Complete by: As directed       Allergies as of 11/14/2022   No Known Allergies      Medication List     TAKE these medications    amLODipine 10 MG tablet Commonly known as: NORVASC TAKE 1 TABLET (10 MG TOTAL) BY MOUTH DAILY, KEEP APPT FOR FUTURE REFILLS!   gabapentin 300 MG capsule Commonly known as: NEURONTIN TAKE 1 CAPSULE (300 MG TOTAL) BY MOUTH AT BEDTIME, KEEP APPT  FOR FUTURE REFILLS!   levETIRAcetam 500 MG tablet Commonly known as: KEPPRA Take 1 tablet (500 mg total) by mouth 2 (two) times daily.   methocarbamol 500 MG tablet Commonly known as: ROBAXIN Take 1 tablet (500 mg total) by mouth 2 (two) times daily.   traMADol 50 MG tablet Commonly known as: Ultram Take 1 tablet (50 mg total) by mouth every 6 (six) hours as needed for up to 5 days (postop pain not controlled with tylenol and ibuprofen first).        Follow-up Information     Andria Meuse, MD Follow up on 11/29/2022.   Specialties: General Surgery, Colon and Rectal Surgery Why: Please arrive by 1:15 pm Contact information: [address] [place] [address] [phone]                  The results of significant diagnostics from this hospitalization (including imaging, microbiology, ancillary and laboratory) are listed below for reference.    Significant Diagnostic Studies: No results found.  Labs: Basic Metabolic Panel: Recent Labs  Lab 11/12/22 0420 11/13/22 0543 11/14/22 0458  NA 132* 135 129*  K 3.6 3.4* 3.9  CL 96* 99 97*  CO2 25 26 23   GLUCOSE 136* 102* 96  BUN 6 <5* 5*  CREATININE 0.93 0.78 0.90  CALCIUM 8.8* 9.0 9.1   Liver Function Tests: No results for input(s): "AST", "ALT", "ALKPHOS", "  BILITOT", "PROT", "ALBUMIN" in the last 168 hours.  CBC: Recent Labs  Lab 11/12/22 0420 11/13/22 0543 11/14/22 0458  WBC 7.7 6.4 6.2  HGB 12.0* 11.5* 12.8*  HCT 34.5* 33.5* 36.9*  MCV 95.0 96.8 96.9  PLT 197 173 198    CBG: No results for input(s): "GLUCAP" in the last 168 hours.  Principal Problem:   S/P partial colectomy   Time coordinating discharge: 15 min

## 2022-11-17 ENCOUNTER — Telehealth: Payer: Self-pay | Admitting: Internal Medicine

## 2022-11-17 ENCOUNTER — Telehealth: Payer: Self-pay

## 2022-11-17 NOTE — Transitions of Care (Post Inpatient/ED Visit) (Signed)
11/17/2022  Name: Michael Oneill MRN: [id number] DOB: 06-11-1975  Today's TOC FU Call Status: Today's TOC FU Call Status:: Successful TOC FU Call Competed TOC FU Call Complete Date: 11/17/22  Transition Care Management Follow-up Telephone Call Date of Discharge: 11/14/22 Discharge Facility: [facility]) Type of Discharge: Inpatient Admission Primary Inpatient Discharge Diagnosis:: "s/p partial colectomy" How have you been since you were released from the hospital?: Better (Pt reports he is doing well-pain currently 3/10-last took pain med about 4hrs ago. He has been eating-appetite increasing-had some soup-drinking plenty of fluids. LBM yesterday. Surgical drsg intact and no issues. He has been up walking & moving around.) Any questions or concerns?: No  Items Reviewed: Did you receive and understand the discharge instructions provided?: Yes Medications obtained,verified, and reconciled?: Yes (Medications Reviewed) Any new allergies since your discharge?: No Dietary orders reviewed?: Yes Type of Diet Ordered:: reviewed diet instructions per d/c paperwork Do you have support at home?: Yes People in Home: alone, parent(s) Name of Support/Comfort Primary Source: Mom  Medications Reviewed Today: Medications Reviewed Today     Reviewed by Charlyn Minerva, RN (Registered Nurse) on 11/17/22 at 1023  Med List Status: <None>   Medication Order Taking? Sig Documenting Provider Last Dose Status Informant  amLODipine (NORVASC) 10 MG tablet [id number] Yes TAKE 1 TABLET (10 MG TOTAL) BY MOUTH DAILY, KEEP APPT FOR FUTURE REFILLS! Marcine Matar, MD Taking Active Self  gabapentin (NEURONTIN) 300 MG capsule [id number] Yes TAKE 1 CAPSULE (300 MG TOTAL) BY MOUTH AT BEDTIME, KEEP APPT FOR FUTURE REFILLS! Marcine Matar, MD Taking Active Self  levETIRAcetam (KEPPRA) 500 MG tablet [id number] Yes Take 1 tablet (500 mg total) by mouth 2 (two) times daily. Marcine Matar, MD  Taking Active Self  methocarbamol (ROBAXIN) 500 MG tablet [id number] No Take 1 tablet (500 mg total) by mouth 2 (two) times daily.  Patient not taking: Reported on 10/28/2022   Olene Floss, PA-C Not Taking Active Self  traMADol (ULTRAM) 50 MG tablet [id number] Yes Take 1 tablet (50 mg total) by mouth every 6 (six) hours as needed for up to 5 days (postop pain not controlled with tylenol and ibuprofen first). Andria Meuse, MD Taking Active             Home Care and Equipment/Supplies: Were Home Health Services Ordered?: NA Any new equipment or medical supplies ordered?: NA  Functional Questionnaire: Do you need assistance with bathing/showering or dressing?: No Do you need assistance with meal preparation?: No Do you need assistance with eating?: No Do you have difficulty maintaining continence: No Do you need assistance with getting out of bed/getting out of a chair/moving?: No Do you have difficulty managing or taking your medications?: No  Follow up appointments reviewed: PCP Follow-up appointment confirmed?: Yes Date of PCP follow-up appointment?: 11/22/22 Follow-up Provider: Dr. Laural Benes Specialist [facility] Follow-up appointment confirmed?: Yes Date of Specialist follow-up appointment?: 11/29/22 Follow-Up Specialty Provider:: Dr. Annamarie Dawley Do you need transportation to your follow-up appointment?: No (pt confirms he has someone to take him to appts) Do you understand care options if your condition(s) worsen?: Yes-patient verbalized understanding  SDOH Interventions Today    Flowsheet Row Most Recent Value  SDOH Interventions   Food Insecurity Interventions Intervention Not Indicated  Transportation Interventions Intervention Not Indicated      TOC Interventions Today    Flowsheet Row Most Recent Value  TOC Interventions   TOC Interventions Discussed/Reviewed TOC Interventions Discussed, Post op wound/incision care, Post discharge  activity  limitations per provider, S/S of infection, Arranged PCP follow up within 7 days/Care Guide scheduled  [pt had PCP appt prior to admission-care guide assisted with changing visit type to hosp f/u]      Interventions Today    Flowsheet Row Most Recent Value  General Interventions   General Interventions Discussed/Reviewed General Interventions Discussed, Doctor Visits  Doctor Visits Discussed/Reviewed Doctor Visits Discussed, PCP, Specialist  PCP/Specialist Visits Compliance with follow-up visit  Education Interventions   Education Provided Provided Education  Provided Verbal Education On Nutrition, When to see the doctor, Medication  Nutrition Interventions   Nutrition Discussed/Reviewed Nutrition Discussed, Increasing proteins, Decreasing salt, Decreasing fats, Fluid intake  [reviewed post op diet restrictions and instructions per d/c paperwork]  Pharmacy Interventions   Pharmacy Dicussed/Reviewed Pharmacy Topics Discussed  Safety Interventions   Safety Discussed/Reviewed Safety Discussed       Alessandra Grout [facility] Care Management Care Management Community Coordinator Direct Phone: [phone] Toll Free: [phone] Fax: [phone]

## 2022-11-17 NOTE — Telephone Encounter (Signed)
Phone call placed to patient this morning to inform of pathology results.  He had part of the transverse colon removed surgically by Dr. Marin Olp several days ago.  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  Patient informed that had this remain in place, it certainly would have progressed to full-blown colon cancer.  He reports that he is doing well postoperatively.  He has a postop visit with Dr. Cliffton Asters next month which he plans to keep.

## 2022-11-22 ENCOUNTER — Encounter: Payer: Self-pay | Admitting: Internal Medicine

## 2022-11-22 ENCOUNTER — Ambulatory Visit: Payer: 59 | Attending: Internal Medicine | Admitting: Internal Medicine

## 2022-11-22 VITALS — BP 111/75 | HR 80 | Temp 98.5°F | Ht 69.0 in | Wt 132.0 lb

## 2022-11-22 DIAGNOSIS — I10 Essential (primary) hypertension: Secondary | ICD-10-CM

## 2022-11-22 DIAGNOSIS — Z09 Encounter for follow-up examination after completed treatment for conditions other than malignant neoplasm: Secondary | ICD-10-CM | POA: Diagnosis not present

## 2022-11-22 DIAGNOSIS — F1020 Alcohol dependence, uncomplicated: Secondary | ICD-10-CM | POA: Diagnosis not present

## 2022-11-22 DIAGNOSIS — E871 Hypo-osmolality and hyponatremia: Secondary | ICD-10-CM

## 2022-11-22 DIAGNOSIS — D126 Benign neoplasm of colon, unspecified: Secondary | ICD-10-CM | POA: Diagnosis not present

## 2022-11-22 DIAGNOSIS — D649 Anemia, unspecified: Secondary | ICD-10-CM

## 2022-11-22 DIAGNOSIS — Z9049 Acquired absence of other specified parts of digestive tract: Secondary | ICD-10-CM

## 2022-11-22 DIAGNOSIS — F17200 Nicotine dependence, unspecified, uncomplicated: Secondary | ICD-10-CM | POA: Diagnosis not present

## 2022-11-22 NOTE — Progress Notes (Signed)
Patient ID: Michael Oneill, male    DOB: 11-18-1975  MRN: [id number]  CC: TOC Date of hospitalization 7/25-28/2024 Date of call from RN: 11/17/2022  Hospitalization Follow-up (Hospitalization f/u. /No questions / concerns)   Subjective: Michael Oneill is a 47 y.o. male who presents for transition of care visit His concerns today include:  HTN, tob dep, Sz disorder since 1988,  subdural hematoma (05/2015), on Neurontin for weakness and numbness in RT arm and leg and tobacco dependence, depression.     Patient had colonoscopy 07/2022.  He had adenomatous polyps removed from the colon.  Noted to have obstructive mass/polyp in the transverse colon.  Referred to surgeon Dr. Cliffton Asters.  Patient hospitalized for partial colectomy of the transverse colon.  He had a mass removed with pathology showing tubulovillous adenoma with focal high-grade dysplasia.  No invasive malignancy, margins were clean and 0/8 LN. Had some post-op anemia and low Na level.  Last drank 11/09/2022  Today:  Reports he is healing well.  Eating normal foods and regular bowel movements.  Has f/u with Dr. Cliffton Asters in 1 wk HTN:  taking Norvasc.  No CP/SOB SZ:  no recent sz.  Reports taking Keppra BID. Tob dep:  smoking 6 cigarettes a day.  Not ready to quit.   Drinks a six pack beer/day.  Last drank 11/09/2022  Patient Active Problem List   Diagnosis Date Noted   S/P partial colectomy 11/11/2022   Major depressive disorder, single episode, moderate (HCC) 05/07/2022   Mixed hyperlipidemia 04/30/2020   Influenza vaccine refused 04/28/2020   Essential hypertension 04/21/2018   Tobacco dependence 04/21/2018   Neuropathy 06/30/2015   Seizures (HCC) 06/03/2015   INGUINAL HERNIA 02/06/2007   HERNIORRHAPHY, HX OF 02/06/2007     Current Outpatient Medications on File Prior to Visit  Medication Sig Dispense Refill   amLODipine (NORVASC) 10 MG tablet TAKE 1 TABLET (10 MG TOTAL) BY MOUTH DAILY, KEEP APPT FOR FUTURE REFILLS! 30  tablet 6   gabapentin (NEURONTIN) 300 MG capsule TAKE 1 CAPSULE (300 MG TOTAL) BY MOUTH AT BEDTIME, KEEP APPT FOR FUTURE REFILLS! 30 capsule 6   levETIRAcetam (KEPPRA) 500 MG tablet Take 1 tablet (500 mg total) by mouth 2 (two) times daily. 60 tablet 6   methocarbamol (ROBAXIN) 500 MG tablet Take 1 tablet (500 mg total) by mouth 2 (two) times daily. (Patient not taking: Reported on 11/22/2022) 20 tablet 0   No current facility-administered medications on file prior to visit.    No Known Allergies  Social History   Socioeconomic History   Marital status: Single    Spouse name: Not on file   Number of children: Not on file   Years of education: Not on file   Highest education level: Not on file  Occupational History   Not on file  Tobacco Use   Smoking status: Every Day    Current packs/day: 0.25    Types: Cigarettes   Smokeless tobacco: Never  Vaping Use   Vaping status: Never Used  Substance and Sexual Activity   Alcohol use: Yes    Comment: OCC   Drug use: No   Sexual activity: Not on file  Other Topics Concern   Not on file  Social History Narrative   Not on file   Social Determinants of Health   Financial Resource Strain: Not on file  Food Insecurity: No Food Insecurity (11/17/2022)   Hunger Vital Sign    Worried About Running Out of Food in the  Last Year: Never true    Ran Out of Food in the Last Year: Never true  Transportation Needs: No Transportation Needs (11/17/2022)   PRAPARE - Administrator, Civil Service (Medical): No    Lack of Transportation (Non-Medical): No  Physical Activity: Not on file  Stress: Not on file  Social Connections: Not on file  Intimate Partner Violence: Not At Risk (11/11/2022)   Humiliation, Afraid, Rape, and Kick questionnaire    Fear of Current or Ex-Partner: No    Emotionally Abused: No    Physically Abused: No    Sexually Abused: No    Family History  Problem Relation Age of Onset   Heart failure Mother    Colon  cancer Neg Hx    Esophageal cancer Neg Hx    Rectal cancer Neg Hx    Stomach cancer Neg Hx    Colon polyps Neg Hx     Past Surgical History:  Procedure Laterality Date   BRAIN SURGERY     1997   LAPAROSCOPIC RIGHT HEMI COLECTOMY N/A 11/11/2022   Procedure: LAPAROSCOPIC SEGMENTAL PARTIAL COLECTOMY; TAKEDOWN OF SPELNIC FLEXURE;  Surgeon: Andria Meuse, MD;  Location: [facility];  Service: General;  Laterality: N/A;    ROS: Review of Systems Negative except as stated above  PHYSICAL EXAM: BP 111/75 (BP Location: Left Arm, Patient Position: Sitting, Cuff Size: Normal)   Pulse 80   Temp 98.5 F (36.9 C) (Oral)   Ht 5\' 9"  (1.753 m)   Wt 132 lb (59.9 kg)   SpO2 100%   BMI 19.49 kg/m   Physical Exam   General appearance - alert, well appearing, middle-age African-American male and in no distress Mental status - flat affect Neck - supple, no significant adenopathy Chest - clear to auscultation, no wheezes, rales or rhonchi, symmetric air entry Heart - normal rate, regular rhythm, normal S1, S2, no murmurs, rubs, clicks or gallops Abdomen - clean dressing midline of abdomen Extremities - peripheral pulses normal, no pedal edema, no clubbing or cyanosis     Latest Ref Rng & Units 11/14/2022    4:58 AM 11/13/2022    5:43 AM 11/12/2022    4:20 AM  CMP  Glucose 70 - 99 mg/dL 96  811  914   BUN 6 - 20 mg/dL 5  <5  6   Creatinine 7.82 - 1.24 mg/dL 9.56  2.13  0.86   Sodium 135 - 145 mmol/L 129  135  132   Potassium 3.5 - 5.1 mmol/L 3.9  3.4  3.6   Chloride 98 - 111 mmol/L 97  99  96   CO2 22 - 32 mmol/L 23  26  25    Calcium 8.9 - 10.3 mg/dL 9.1  9.0  8.8    Lipid Panel     Component Value Date/Time   CHOL 307 (H) 12/11/2021 1645   TRIG 76 12/11/2021 1645   HDL 158 12/11/2021 1645   CHOLHDL 1.9 12/11/2021 1645   LDLCALC 138 (H) 12/11/2021 1645    CBC    Component Value Date/Time   WBC 6.2 11/14/2022 0458   RBC 3.81 (L) 11/14/2022 0458   HGB 12.8 (L) 11/14/2022 0458    HGB 13.9 12/11/2021 1645   HCT 36.9 (L) 11/14/2022 0458   HCT 38.6 12/11/2021 1645   PLT 198 11/14/2022 0458   PLT 242 12/11/2021 1645   MCV 96.9 11/14/2022 0458   MCV 94 12/11/2021 1645   MCH 33.6 11/14/2022 0458  MCHC 34.7 11/14/2022 0458   RDW 12.8 11/14/2022 0458   RDW 13.7 12/11/2021 1645   LYMPHSABS 1.7 11/03/2022 0907   MONOABS 0.6 11/03/2022 0907   EOSABS 0.1 11/03/2022 0907   BASOSABS 0.1 11/03/2022 0907    ASSESSMENT AND PLAN: 1. Hospital discharge follow-up   2. S/P partial colectomy 3. Tubulovillous adenoma of large intestine Patient to keep his follow-up appointment with Dr. Cliffton Asters next week.  4. Essential hypertension At goal.  Continue Norvasc  5. Postoperative anemia Recheck CBC today.  Patient is asymptomatic. - CBC  6. Hyponatremia Recheck chemistry today. - Basic metabolic panel  7. Alcohol use disorder, moderate, dependence (HCC) Advised patient that he is drinking too much.  Strongly encouraged him to cut back as drinking alcohol in excess for long periods of time can negatively impact his health.  Advised to drink more than two 12 lungs cans of beer a day.  8. Tobacco dependence Strongly advised to quit.  Patient not ready to give a trial of quitting.   Patient was given the opportunity to ask questions.  Patient verbalized understanding of the plan and was able to repeat key elements of the plan.   This documentation was completed using Paediatric nurse.  Any transcriptional errors are unintentional.  No orders of the defined types were placed in this encounter.    Requested Prescriptions    No prescriptions requested or ordered in this encounter    No follow-ups on file.  Jonah Blue, MD, FACP

## 2022-11-23 NOTE — Addendum Note (Signed)
Addended by: Jonah Blue B on: 11/23/2022 06:03 PM   Modules accepted: Orders

## 2022-12-07 ENCOUNTER — Telehealth: Payer: Self-pay | Admitting: *Deleted

## 2022-12-07 NOTE — Telephone Encounter (Signed)
Patient called and wanted to review his lab results again:  Let patient know that he still has a mild stable anemia.  I will ask the lab to check iron levels and will get back to him with the results.  Kidney function is okay.  Sodium level has normalized.   Labs reviewed and patient has labs ordered that need to be collected- appointment was made.

## 2022-12-07 NOTE — Telephone Encounter (Signed)
Noted  

## 2022-12-09 ENCOUNTER — Other Ambulatory Visit: Payer: 59

## 2022-12-15 ENCOUNTER — Ambulatory Visit: Payer: 59 | Attending: Internal Medicine

## 2022-12-15 DIAGNOSIS — D649 Anemia, unspecified: Secondary | ICD-10-CM | POA: Diagnosis not present

## 2022-12-15 DIAGNOSIS — K6389 Other specified diseases of intestine: Secondary | ICD-10-CM

## 2022-12-16 LAB — IRON,TIBC AND FERRITIN PANEL
Ferritin: 393 ng/mL (ref 30–400)
Iron Saturation: 18 % (ref 15–55)
Iron: 50 ug/dL (ref 38–169)
Total Iron Binding Capacity: 277 ug/dL (ref 250–450)
UIBC: 227 ug/dL (ref 111–343)

## 2022-12-18 ENCOUNTER — Other Ambulatory Visit: Payer: Self-pay | Admitting: Internal Medicine

## 2022-12-18 DIAGNOSIS — I10 Essential (primary) hypertension: Secondary | ICD-10-CM

## 2022-12-21 NOTE — Telephone Encounter (Signed)
Requested Prescriptions  Pending Prescriptions Disp Refills   amLODipine (NORVASC) 10 MG tablet [Pharmacy Med Name: AMLODIPINE BESYLATE 10 MG TAB] 90 tablet 1    Sig: TAKE 1 TABLET BY MOUTH EVERY DAY     Cardiovascular: Calcium Channel Blockers 2 Passed - 12/18/2022 12:25 PM      Passed - Last BP in normal range    BP Readings from Last 1 Encounters:  11/22/22 111/75         Passed - Last Heart Rate in normal range    Pulse Readings from Last 1 Encounters:  11/22/22 80         Passed - Valid encounter within last 6 months    Recent Outpatient Visits           4 weeks ago Hospital discharge follow-up   [facility] Marcine Matar, MD   5 months ago Mixed hyperlipidemia   [facility] [place], [place], [place]   7 months ago Essential hypertension   [place] [facility] Marcine Matar, MD   1 year ago Essential hypertension   [place] [facility] Marcine Matar, MD   1 year ago Essential hypertension   [place] [facility] Drucilla Chalet, RPH-CPP       Future Appointments             In 3 months Laural Benes, Binnie Rail, MD [facility] [place] [facility]

## 2022-12-22 ENCOUNTER — Other Ambulatory Visit: Payer: Self-pay | Admitting: Internal Medicine

## 2022-12-22 NOTE — Telephone Encounter (Unsigned)
Copied from CRM [id number]. Topic: General - Other >> Dec 22, 2022  4:05 PM Everette C wrote: Reason for CRM: Medication Refill - Medication: Rx #: [id number]  gabapentin (NEURONTIN) 300 MG capsule [[id number]]    Has the patient contacted their pharmacy? Yes.   (Agent: If no, request that the patient contact the pharmacy for the refill. If patient does not wish to contact the pharmacy document the reason why and proceed with request.) (Agent: If yes, when and what did the pharmacy advise?)  Preferred Pharmacy (with phone number or street name): [phone] [place], [place] - [address] AT [facility] [address] [place] [address] Phone: [phone] Fax: [phone] Hours: Not open 24 hours   Has the patient been seen for an appointment in the last year OR does the patient have an upcoming appointment? Yes.    Agent: Please be advised that RX refills may take up to 3 business days. We ask that you follow-up with your pharmacy.

## 2022-12-23 MED ORDER — GABAPENTIN 300 MG PO CAPS: 300.0000 mg | ORAL_CAPSULE | Freq: Every day | ORAL | 1 refills | Status: DC

## 2022-12-23 NOTE — Telephone Encounter (Signed)
Requested medications are due for refill today.  yes  Requested medications are on the active medications list.  yes  Last refill. 12/11/2021 #30 6 rf  Future visit scheduled.   yes  Notes to clinic.  Rx expired 12/11/2022. Please review for refill.    Requested Prescriptions  Pending Prescriptions Disp Refills   gabapentin (NEURONTIN) 300 MG capsule 30 capsule 6    Sig: TAKE 1 CAPSULE (300 MG TOTAL) BY MOUTH AT BEDTIME, KEEP APPT FOR FUTURE REFILLS!     Neurology: Anticonvulsants - gabapentin Passed - 12/22/2022  4:21 PM      Passed - Cr in normal range and within 360 days    Creatinine, Ser  Date Value Ref Range Status  11/22/2022 1.14 0.76 - 1.27 mg/dL Final         Passed - Completed PHQ-2 or PHQ-9 in the last 360 days      Passed - Valid encounter within last 12 months    Recent Outpatient Visits           1 month ago Hospital discharge follow-up   [facility] Marcine Matar, MD   5 months ago Mixed hyperlipidemia   [facility] [place], [place], [place]   7 months ago Essential hypertension   [facility] Marcine Matar, MD   1 year ago Essential hypertension   [facility] Marcine Matar, MD   1 year ago Essential hypertension   [place] [facility] Drucilla Chalet, RPH-CPP       Future Appointments             In 3 months Laural Benes, Binnie Rail, MD [facility]

## 2023-03-24 ENCOUNTER — Ambulatory Visit: Payer: 59 | Admitting: Internal Medicine

## 2023-05-24 ENCOUNTER — Ambulatory Visit: Payer: 59 | Admitting: Internal Medicine

## 2023-06-23 ENCOUNTER — Other Ambulatory Visit: Payer: Self-pay | Admitting: Internal Medicine

## 2023-06-23 DIAGNOSIS — G40909 Epilepsy, unspecified, not intractable, without status epilepticus: Secondary | ICD-10-CM

## 2023-06-23 DIAGNOSIS — I10 Essential (primary) hypertension: Secondary | ICD-10-CM

## 2023-07-05 ENCOUNTER — Ambulatory Visit: Payer: 59 | Admitting: Internal Medicine

## 2023-07-27 ENCOUNTER — Other Ambulatory Visit: Payer: Self-pay | Admitting: Internal Medicine

## 2023-07-27 DIAGNOSIS — I10 Essential (primary) hypertension: Secondary | ICD-10-CM

## 2023-07-27 DIAGNOSIS — G40909 Epilepsy, unspecified, not intractable, without status epilepticus: Secondary | ICD-10-CM

## 2023-08-25 ENCOUNTER — Ambulatory Visit: Attending: Internal Medicine | Admitting: Internal Medicine

## 2023-10-27 ENCOUNTER — Ambulatory Visit: Admitting: Internal Medicine

## 2023-12-13 ENCOUNTER — Telehealth: Payer: Self-pay | Admitting: Internal Medicine

## 2023-12-13 NOTE — Telephone Encounter (Signed)
 Called patient to confirm upcoming appointment 12/15/2023, no answer. Unable to leave VM.

## 2023-12-15 ENCOUNTER — Ambulatory Visit (HOSPITAL_BASED_OUTPATIENT_CLINIC_OR_DEPARTMENT_OTHER): Admitting: Internal Medicine

## 2023-12-15 VITALS — BP 108/71 | HR 69 | Temp 97.8°F | Ht 69.0 in | Wt 137.0 lb

## 2023-12-15 DIAGNOSIS — I10 Essential (primary) hypertension: Secondary | ICD-10-CM

## 2023-12-15 DIAGNOSIS — Z5321 Procedure and treatment not carried out due to patient leaving prior to being seen by health care provider: Secondary | ICD-10-CM

## 2023-12-15 DIAGNOSIS — G40909 Epilepsy, unspecified, not intractable, without status epilepticus: Secondary | ICD-10-CM

## 2023-12-21 MED ORDER — AMLODIPINE BESYLATE 10 MG PO TABS: 10.0000 mg | ORAL_TABLET | Freq: Every day | ORAL | 0 refills | Status: DC

## 2023-12-21 MED ORDER — LEVETIRACETAM 500 MG PO TABS
500.0000 mg | ORAL_TABLET | Freq: Two times a day (BID) | ORAL | 0 refills | Status: DC
Start: 2023-12-21 — End: 2024-01-31

## 2023-12-21 MED ORDER — GABAPENTIN 300 MG PO CAPS: 300.0000 mg | ORAL_CAPSULE | Freq: Every day | ORAL | 0 refills | Status: DC

## 2023-12-21 NOTE — ED Provider Notes (Signed)
 Patient placed in First Look pathway, seen and evaluated for chief complaint of fall. Pt was intoxicated and fell hitting his face. Seen at [facility] and sent here for head CT, pt appears intoxicated but otherwise neurologically inteact.  Based on initial evaluation, labs are not currently indicated and radiology studies are currently indicated as allowed for current processes and treatments as applicable in a triage setting and could be different than if patient were seen in a main treatment area or dependent on labs/imagining after results are displayed.  Patient counseled on process, plan, and necessity for staying for completing the evaluation.   This document serves as a record of services personally performed by Fonda Sheffield PA-C.  The creation of this record is the provider's dictation and/or activities during the visit.     Note By: Fonda Sheffield, PA-C 8:40 PM    [facility] Emergency Department Emergency Department Provider Note  This document was created using the aid of voice recognition Dragon dictation software  ____________________________________________  Time seen: 10:36 PM  I have reviewed the triage vital signs and the nursing notes.   History   Chief Complaint Fall   HPI  Michael Oneill is a 48 y.o. male who presents from UC for mechanical fall with facial trauma.  Patient states he tripped going up steps and fell face forward, hitting his nose. This occurred around 2:30 PM. Denies LOC. Reports epistaxis afterwards which resolved PTA.  Endorses daily alcohol use. He does not want detox at this time.  History of seizure disorder. Takes Keppra  every other day. States last seizure was in 2011.   History of intracranial bleeding requiring brain surgery 1997.  He took his amlodipine  today. Did not take Keppra .   Physical Exam   VITAL SIGNS:   ED Triage Vitals [12/21/23 2035]  Temp 98.1 F (36.7 C)  Heart Rate 79  Resp 18  BP 130/85  MAP  (mmHg) 100  SpO2 98 %  O2 Device None (Room air)  O2 Flow Rate (L/min)   Weight 62.5 kg (137 lb 12.8 oz)    Constitutional: Alert and oriented. In no distress. Eyes: Conjunctivae are normal.  No periorbital ecchymosis. ENT      Head: Normocephalic. Abrasion to bridge of nose.      Neck: No stridor.      Ears: tympanic membranes normal bilaterally.      Nose: Mild dried blood to nares, no septal hematoma.  No definitive deformity to the nose. Cardiovascular: Normal rate and rhythm. No cardiac murmur auscultated. Heart rate: 77. Blood pressure 107/77. Respiratory: Normal respiratory effort. Breath sounds are normal. Oxygen saturation 98% on room air. Gastrointestinal: Soft and nontender. Musculoskeletal: No deformities noted. No leg swelling. No spine or rib tenderness to palpation. Neurologic:  Alert and oriented x 3. No slurred speech or facial droop. Cranial nerves are intact. EOMI. No visual field cuts, visual fields normal. Finger to nose normal bilaterally. Motor strength 5 out of 5 in all four extremities. Sensation to light touch intact in all 4 extremities and torso.  Psychiatric: Mood and affect are normal.   EKG   According to my interpretation the ECG shows None  Radiology   All X-rays, CTs, and MRIs interpreted by radiologist and interpretation reviewed by me.  Procedures   Procedure(s) performed: None.  Pertinent labs & imaging results that were available during my care of the patient were reviewed by me and considered in my medical decision making (see chart for details).  Total critical care time was 0 minutes. This was spent providing cardiovascular or respiratory resuscitation in a critically ill patient.    ED Clinical Impression   1. Fall, initial encounter Active  2. Injury of head, initial encounter Active  3. Alcohol use disorder Active  4. History of epilepsy Active    Medical Decision Making: Initial Impression, ED Course, Assessment and  Plan   Per my interpretation the bedside cardiac monitoring shows: normal sinus rhythm.  The following chart(s) was reviewed: Discharge summary from 11/06/2023 the patient had a laparoscopic-assisted partial.  Colectomy secondary to polypoid mass in the sigmoid colon with high-grade dysplasia on biopsy.  Medical Decision Making 48 year old male presents with fall. Fall was from standing and considered a low energy mechanism. Differential diagnosis includes subdural hematoma, epidural hematoma, skull fracture, cervical spine fracture or dislocation, concussion (minor TBI), rib fracture, pneumothorax, intraabdominal trauma including spleen or liver laceration, upper extremity trauma including humerus fracture or shoulder fracture or shoulder dislocation, pelvis fracture, hip fracture or dislocation, other extremity injuries. CT head considered and was. Diagnostic work up included CT. I have discussed with the patient additional CTs to the patient including CT of the face to exclude nasal bone fracture and also consideration of CT cervical spine to exclude cervical spine fracture.  The patient does not have significant bony nasal tenderness to palpation and does not have obvious deformities.  There is no septal hematoma.  The patient also has no spinal tenderness to palpation.  However the patient has drank alcohol today.  The patient declines additional CT scans.  The patient is clinically sober in my opinion and I believe he has a medical decision-making capacity.  The patient will be started on thiamine for chronic alcohol use disorder.  The patient was offered the consideration of alcohol detox but declines.  The patient understands if he develops neck pain or worsening nose pain to return to the emergency department for additional imaging.  I will refill the patient's Keppra .  I have discussed with him that head injuries do increase the risk of having breakthrough seizures in the setting of epilepsy as  does alcohol use.  The patient reports last tetanus was 1 year ago.  Problems Addressed: Alcohol use disorder: complicated acute illness or injury Fall, initial encounter: complicated acute illness or injury History of epilepsy: complicated acute illness or injury Injury of head, initial encounter: complicated acute illness or injury  Amount and/or Complexity of Data Reviewed External Data Reviewed: notes. Radiology: ordered.  Risk OTC drugs. Prescription drug management.    Clinical Complexity  Patient's presentation is most consistent with acute presentation with potential threat to life or bodily function.  Patient's history of seizures, daily alcohol use, brain surgery increases the complexity of managing their presentation with fall.    Provider time spent in patient care today, inclusive of but not limited to clinical reassessment, review of diagnostic studies, and discharge preparation, was greater than 30 minutes.       This document serves as a record of services personally performed by Dr. Fairy Roger Brain, MD. It was created on their behalf by Chiquita A. Jacques, Stage manager, a trained Stage manager. The creation of this record is the provider's dictation and/or activities during the visit.   Electronically signed by: Chiquita FELIX Jacques, Medical Scribe 12/21/2023 9:52 PM

## 2023-12-21 NOTE — Progress Notes (Signed)
 Pt left before being seen. He has rescheduled.

## 2023-12-21 NOTE — Progress Notes (Signed)
 HPI:   Chief Complaint  Patient presents with  . Shoulder Pain    + LEFT shoulder pain and nose pain from falling up the driveway at 1 pm today. Patient has not taken anything for his pain. Per patient he can not blow out of his LEFT nostril. He has a hx of seizures.    Shoulder Pain  Pertinent negatives include no numbness.   Michael Oneill is a 48 y.o. male Patient presents the clinic with complaints of epistaxis and left shoulder pain.  According to staff reports patient presented to the urgent care clinic assisted by a male companion and was seen at the front desk leaning over the front desk and mumbling his reason for coming to the clinic.  Staff that witnessed this entrance indicates that the male companion was assisting the patient into the clinic.  Once the patient was brought back to the exam room CMA called provider to the room due to feeling uncomfortable due to patient's behavior.  Patient was not being aggressive, however had some mumbled speech and evidence of confusion with answering questions.  Chart review and some review with the patient indicates history of seizure disorder with reported most recent seizure in 2011.  He also has a history of intracranial bleeding in 1997 with subsequent brain surgery per his report.  He admits to daily alcohol use of at least a 12 pack a day and he admits to drinking 2 quarts today with the most recent alcohol intake at approximately 3 PM today.  He states the reason for his epistaxis and left shoulder pain is due to a fall that occurred at approximately 2 PM this afternoon.  The patient initially told CMA who triaged the patient in the room that the incident occurred at 1 PM.  He states the fall occurred due to catching his sandal on a brick step.  He states he fell forward and hit his nose on one of the steps.  He is unsure how his shoulder was injured in the fall.  He is not on any blood thinner.  He states he did not take his seizure  medicine today.  He denies visual disturbance, current headache, chest pain, shortness of breath.  Denies loss of consciousness with the fall.  It appears that he has been able to successfully stop the nosebleed as there is no active bleeding upon initial evaluation in the clinic.  He denies focal neurologic changes such as extremity weakness, change of speech, numbness or tingling.  States he has pain only in his nose and no additional facial pain.  He reports anterior left shoulder pain.  He does not recall if he had previous left shoulder injury.  He denies current headache or dizziness. Medical History[1]  Current Medications[2]   ROS:    Review of Systems  Eyes:  Negative for visual disturbance.  Cardiovascular:  Negative for chest pain, palpitations and leg swelling.  Musculoskeletal:        Left shoulder pain  Skin:        Superficial abrasion at the bridge of his nose   Neurological:  Negative for dizziness, seizures, syncope, facial asymmetry, numbness and headaches.     OBJECTIVE:    Vitals:   12/21/23 1830  BP: 123/78  BP Location: Left arm  Patient Position: Sitting  Pulse: 81  Resp: 18  Temp: 98.9 F (37.2 C)  TempSrc: Temporal  SpO2: 96%  Weight: 62.1 kg (137 lb)    Physical Exam Vitals and  nursing note reviewed.  Constitutional:      General: He is awake. He is not in acute distress.    Appearance: He is well-developed and normal weight. He is not ill-appearing or toxic-appearing.     Comments: Upon my evaluation the patient is alert and oriented with answering questions appropriately, however does have some mumbling speech.  At my exam patient had a smooth gait without evidence of ataxia/balance disturbance.  He was able to ambulate independently.  I did have to repeat instructions for patient to follow certain commands such as checking extraocular motions and checking neurologic status.  HENT:     Head: Normocephalic. Abrasion present. No Battle's sign or  laceration.     Jaw: No trismus, tenderness or pain on movement.     Comments: Superficial abrasion at the bridge of his nose.  Mild swelling at the nose.    Right Ear: Hearing normal.     Left Ear: Hearing normal.     Nose: Signs of injury and nasal tenderness present. No rhinorrhea.     Right Nostril: No epistaxis or occlusion.     Left Nostril: Septal hematoma present. No epistaxis or occlusion.     Right Sinus: No maxillary sinus tenderness or frontal sinus tenderness.     Left Sinus: No maxillary sinus tenderness or frontal sinus tenderness.     Comments: There may be subtle left septal deviation.    Mouth/Throat:     Lips: Pink.     Mouth: Mucous membranes are moist. No oral lesions or angioedema.     Tongue: Tongue does not deviate from midline.     Pharynx: Uvula midline. No uvula swelling.  Eyes:     Extraocular Movements: Extraocular movements intact.     Right eye: Nystagmus present.     Left eye: Nystagmus present.     Conjunctiva/sclera: Conjunctivae normal.     Right eye: Right conjunctiva is not injected. No hemorrhage.    Left eye: Left conjunctiva is not injected. No hemorrhage.    Comments: Pupils appear somewhat slow to respond.  Neck:     Vascular: No JVD.     Trachea: Trachea and phonation normal. No abnormal tracheal secretions or tracheal deviation.  Cardiovascular:     Rate and Rhythm: Normal rate and regular rhythm.  Pulmonary:     Effort: Pulmonary effort is normal. No tachypnea, prolonged expiration or respiratory distress.     Breath sounds: Normal breath sounds and air entry. No decreased breath sounds, wheezing, rhonchi or rales.  Musculoskeletal:     Cervical back: Normal range of motion. No pain with movement or spinous process tenderness.     Right lower leg: No edema.     Left lower leg: No edema.     Comments: Exam of the left shoulder: Skin is clean, dry, intact.  No erythema or visible ecchymosis.  There may be some mild swelling.  Active  assisted range of motion at least 140 degrees forward flexion and abduction.  He reports tenderness to palpation at the anterior and superior aspect of the shoulder.  Internal rotation to the PSIS level.  External rotation approximately 60 degrees.  Shoulder appears to be located without obvious dislocation.  Skin:    General: Skin is warm and dry.     Capillary Refill: Capillary refill takes less than 2 seconds.  Neurological:     General: No focal deficit present.     Mental Status: He is alert and oriented to person, place,  and time.     Cranial Nerves: No facial asymmetry.     Sensory: Sensation is intact.     Motor: No seizure activity or pronator drift.     Coordination: Romberg sign negative. Finger-Nose-Finger Test abnormal. Rapid alternating movements normal.  Psychiatric:        Attention and Perception: Attention normal.        Mood and Affect: Mood and affect normal.        Speech: Speech normal.        Behavior: Behavior normal. Behavior is cooperative.        Cognition and Memory: Cognition normal.        Judgment: Judgment normal.      ASSESSMENT/ PLAN:   1. Injury of nose, initial encounter      2. History of seizure disorder      3. Fall on steps, initial encounter      4. Alcohol use disorder         No results found for this visit on 12/21/23.  Assessment: 1.  Nasal injury 2.  Status post fall on steps at approximately 1 or 2 PM today 3.  History of seizure disorder 4.  History of intracranial bleeding 5.  Alcohol use disorder  Based on patient's current clinical presentation and medical history I recommend further evaluation in the emergency department so that he can have additional advanced imaging and/or labs as appropriate based on evaluating providers discretion.  He has questionable compliance with his seizure medicine and also has admitted to significant alcohol use today.  I am concerned that he needs additional CT of his head given his history and  new head injury.  Patient is agreeable to have further evaluation in the emergency department.  Patient's friend, Orlean brought back to the exam room and discussion regarding his symptoms and clinical presentation discussed and she is agreeable to transport the patient via personal vehicle to the emergency department for additional evaluation.  Patient discharged from clinic in stable condition, independently ambulatory without assistance.  Discussed with patient and patient's friend the need for further evaluation as he could have more serious condition that we are unable to evaluate here in the clinic that could lead to permanent disability and/or death.  Patient and patient's friend stated understanding and agreeable to further evaluation in the ER. Rx / DME: N/a  Labs / Imaging: Any POCT results obtained in clinic reviewed and addressed as above. Additional labs / imaging:  N/a - deferred to ER  Patient advised that all results will be communicated to them with their preferred method of communication once the results have been received and reviewed.  Treatment plan will be updated based on results.  Referrals / Coordination of care: Patient was seen in the clinic and deemed to have an acute or chronic illness that poses a threat to life or bodily function and as such I recommend patient proceed to the ED for further evaluation and management at a higher level of care than what I can provide in an Urgent Care setting.    The patient's most recent labs were reviewed. Based on the patient's medical history and most recent available lab data, any medications prescribed were adjusted as necessary.   The patient / parent / guardian was advised of signs and symptoms to return to clinic or go to ER with any change / worsening of symptoms.  Patient stated understanding and in agreement with current plan. Patient / caregiver appears reliable and  able to make appropriate decisions to return to clinic  or seek higher level of care if necessary.  Patient currently stable for discharge home / higher level of care with appropriate follow up as discussed.  Counseled regarding condition(s) and all patient questions answered. If a new prescription was given today, then I discussed potential side effects, drug interactions, and instructions for taking the medication.   There are no Patient Instructions on file for this visit.  I agree the documentation is accurate and complete.  Documentation may have been partially / fully completed with the assistance of [employer] AI software. Every effort has been made to ensure accuracy of the record.   Electronically signed by: Lauraine LITTIE Nash, PA-C 12/21/2023 6:52 PM       [1] Past Medical History: Diagnosis Date  . Seizures    (CMD)   [2]  Current Outpatient Medications:  .  amLODIPine  (NORVASC ) 10 mg tablet, Take 1 tablet by mouth daily., Disp: , Rfl:  .  gabapentin  (NEURONTIN ) 300 mg capsule, Take 300 mg by mouth nightly., Disp: , Rfl:  .  levETIRAcetam  (KEPPRA ) 500 mg tablet, Take 1 tablet by mouth in the morning and 1 tablet in the evening., Disp: , Rfl:

## 2024-01-31 ENCOUNTER — Ambulatory Visit: Attending: Internal Medicine | Admitting: Internal Medicine

## 2024-01-31 ENCOUNTER — Encounter: Payer: Self-pay | Admitting: Internal Medicine

## 2024-01-31 VITALS — BP 105/76 | HR 89 | Temp 98.5°F | Ht 69.0 in | Wt 131.0 lb

## 2024-01-31 DIAGNOSIS — F17210 Nicotine dependence, cigarettes, uncomplicated: Secondary | ICD-10-CM | POA: Diagnosis not present

## 2024-01-31 DIAGNOSIS — G40909 Epilepsy, unspecified, not intractable, without status epilepticus: Secondary | ICD-10-CM | POA: Diagnosis not present

## 2024-01-31 DIAGNOSIS — R634 Abnormal weight loss: Secondary | ICD-10-CM

## 2024-01-31 DIAGNOSIS — I10 Essential (primary) hypertension: Secondary | ICD-10-CM | POA: Diagnosis not present

## 2024-01-31 DIAGNOSIS — Z1211 Encounter for screening for malignant neoplasm of colon: Secondary | ICD-10-CM

## 2024-01-31 DIAGNOSIS — F1090 Alcohol use, unspecified, uncomplicated: Secondary | ICD-10-CM

## 2024-01-31 DIAGNOSIS — R630 Anorexia: Secondary | ICD-10-CM | POA: Diagnosis not present

## 2024-01-31 DIAGNOSIS — Z23 Encounter for immunization: Secondary | ICD-10-CM

## 2024-01-31 DIAGNOSIS — R739 Hyperglycemia, unspecified: Secondary | ICD-10-CM

## 2024-01-31 DIAGNOSIS — F17200 Nicotine dependence, unspecified, uncomplicated: Secondary | ICD-10-CM

## 2024-01-31 DIAGNOSIS — Z114 Encounter for screening for human immunodeficiency virus [HIV]: Secondary | ICD-10-CM

## 2024-01-31 DIAGNOSIS — Z2821 Immunization not carried out because of patient refusal: Secondary | ICD-10-CM

## 2024-01-31 MED ORDER — LEVETIRACETAM 500 MG PO TABS
500.0000 mg | ORAL_TABLET | Freq: Two times a day (BID) | ORAL | 1 refills | Status: AC
Start: 2024-01-31 — End: ?

## 2024-01-31 MED ORDER — GABAPENTIN 300 MG PO CAPS: 300.0000 mg | ORAL_CAPSULE | Freq: Every day | ORAL | 1 refills | Status: AC

## 2024-01-31 MED ORDER — AMLODIPINE BESYLATE 10 MG PO TABS: 10.0000 mg | ORAL_TABLET | Freq: Every day | ORAL | 1 refills | Status: AC

## 2024-01-31 NOTE — Progress Notes (Signed)
 Patient ID: Michael Oneill, male    DOB: Sep 04, 1975  MRN: [id number]  CC: Hypertension (HTN f/u. Med refills. Raguel about poor appetite /Yes to flu. No to pneumonia vax)   Subjective: Michael Oneill is a 48 y.o. male who presents for chronic ds management. 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  No invasive malignancy, margins were clean and 0/8 LN .    Discussed the use of AI scribe software for clinical note transcription with the patient, who gave verbal consent to proceed.  History of Present Illness Michael Oneill is a 48 year old male with hypertension and seizure disorder who presents for follow-up of his chronic medical conditions.  He has a history of hypertension and is currently taking amlodipine  10 mg daily. He does not check his blood pressure at home due to the lack of a device. No chest pain or shortness of breath.  He has a seizure disorder and is on Keppra  500 mg twice daily. He has not experienced any seizures since the last visit.  He experiences poor appetite for about a month, with a weight loss of approximately 10 pounds . He recalls weighing 145 pounds about 2 yrs ago but is currently 131 pounds.  When I saw him last August his weight was 134 pounds.  He eats one to two meals a day. Occasional night sweats for a couple of months. No depression, heart racing, feeling hot all the time, problems swallowing, abdominal pain, or blood in stools or urine.  He has a history of colon surgery in 2024 for polyp removal that was a tubulovillous adenoma with high-grade dysplasia and is due for a repeat colonoscopy. He also has a history of mild anemia post-surgery.  He smokes about five cigarettes per day and has reduced his alcohol intake to  one 16-ounce can of beer per day. He had a recent fall while intoxicated, leading to an emergency room visit last mth  He reports chronic numbness in his RT arm and leg and states he takes gabapentin  for this.  He is trying to find a dentist through Medicaid.    Patient Active Problem List   Diagnosis Date Noted   S/P partial colectomy 11/11/2022   Major depressive disorder, single episode, moderate (HCC) 05/07/2022   Mixed hyperlipidemia 04/30/2020   Influenza vaccine refused 04/28/2020   Essential hypertension 04/21/2018   Tobacco dependence 04/21/2018   Neuropathy 06/30/2015   Seizures (HCC) 06/03/2015   Inguinal hernia 02/06/2007   HERNIORRHAPHY, HX OF 02/06/2007     Current Outpatient Medications on File Prior to Visit  Medication Sig Dispense Refill   amLODipine  (NORVASC ) 10 MG tablet Take 1 tablet (10 mg total) by mouth daily. 60 tablet 0   gabapentin  (NEURONTIN ) 300 MG capsule Take 1 capsule (300 mg total) by mouth at bedtime. 60 capsule 0   levETIRAcetam  (KEPPRA ) 500 MG tablet Take 1 tablet (500 mg total) by mouth 2 (two) times daily. 120 tablet 0   methocarbamol  (ROBAXIN ) 500 MG tablet Take 1 tablet (500 mg total) by mouth 2 (two) times daily. (Patient not taking: Reported on 01/31/2024) 20 tablet 0   No current facility-administered medications on file prior to visit.    No Known Allergies  Social History   Socioeconomic History   Marital status: Single  Spouse name: Not on file   Number of children: Not on file   Years of education: Not on file   Highest education level: Not on file  Occupational History   Not on file  Tobacco Use   Smoking status: Every Day    Current packs/day: 0.25    Types: Cigarettes   Smokeless tobacco: Never  Vaping Use   Vaping status: Never Used  Substance and Sexual Activity   Alcohol use: Yes    Comment: OCC   Drug use: No   Sexual activity: Not on file  Other Topics Concern   Not on file  Social History Narrative    Not on file   Social Drivers of Health   Financial Resource Strain: High Risk (12/15/2023)   Overall Financial Resource Strain (CARDIA)    Difficulty of Paying Living Expenses: Hard  Food Insecurity: Food Insecurity Present (12/15/2023)   Hunger Vital Sign    Worried About Running Out of Food in the Last Year: Often true    Ran Out of Food in the Last Year: Often true  Transportation Needs: Unmet Transportation Needs (12/15/2023)   PRAPARE - Transportation    Lack of Transportation (Medical): Yes    Lack of Transportation (Non-Medical): Yes  Physical Activity: Insufficiently Active (12/15/2023)   Exercise Vital Sign    Days of Exercise per Week: 2 days    Minutes of Exercise per Session: 30 min  Stress: Stress Concern Present (12/15/2023)   [employer] of Occupational Health - Occupational Stress Questionnaire    Feeling of Stress: Very much  Social Connections: Moderately Isolated (12/15/2023)   Social Connection and Isolation Panel    Frequency of Communication with Friends and Family: More than three times a week    Frequency of Social Gatherings with Friends and Family: More than three times a week    Attends Religious Services: More than 4 times per year    Active Member of [employer] or Organizations: No    Attends Banker Meetings: Never    Marital Status: Never married  Intimate Partner Violence: Not At Risk (12/15/2023)   Humiliation, Afraid, Rape, and Kick questionnaire    Fear of Current or Ex-Partner: No    Emotionally Abused: No    Physically Abused: No    Sexually Abused: No    Family History  Problem Relation Age of Onset   Heart failure Mother    Colon cancer Neg Hx    Esophageal cancer Neg Hx    Rectal cancer Neg Hx    Stomach cancer Neg Hx    Colon polyps Neg Hx     Past Surgical History:  Procedure Laterality Date   BRAIN SURGERY     1997   LAPAROSCOPIC RIGHT HEMI COLECTOMY N/A 11/11/2022   Procedure: LAPAROSCOPIC SEGMENTAL PARTIAL  COLECTOMY; TAKEDOWN OF SPELNIC FLEXURE;  Surgeon: Teresa Lonni HERO, MD;  Location: [facility];  Service: General;  Laterality: N/A;    ROS: Review of Systems Negative except as stated above  PHYSICAL EXAM: BP 105/76 (BP Location: Left Arm, Patient Position: Sitting, Cuff Size: Normal)   Pulse 89   Temp 98.5 F (36.9 C) (Oral)   Ht 5' 9 (1.753 m)   Wt 131 lb (59.4 kg)   SpO2 99%   BMI 19.35 kg/m   Wt Readings from Last 3 Encounters:  01/31/24 131 lb (59.4 kg)  12/15/23 137 lb (62.1 kg)  11/22/22 132 lb (59.9 kg)    Physical Exam  General appearance - alert, middle age AAM, and in no distress. Appears a little underweight for height Mental status - normal mood, behavior, speech, dress, motor activity, and thought processes Neck - supple, no significant adenopathy Chest - clear to auscultation, no wheezes, rales or rhonchi, symmetric air entry Heart - normal rate, regular rhythm, normal S1, S2, no murmurs, rubs, clicks or gallops Extremities - peripheral pulses normal, no pedal edema, no clubbing or cyanosis     Latest Ref Rng & Units 11/22/2022    2:21 PM 11/14/2022    4:58 AM 11/13/2022    5:43 AM  CMP  Glucose 70 - 99 mg/dL 897  96  897   BUN 6 - 24 mg/dL 11  5  <5   Creatinine 0.76 - 1.27 mg/dL 8.85  9.09  9.21   Sodium 134 - 144 mmol/L 134  129  135   Potassium 3.5 - 5.2 mmol/L 4.0  3.9  3.4   Chloride 96 - 106 mmol/L 90  97  99   CO2 20 - 29 mmol/L 30  23  26    Calcium 8.7 - 10.2 mg/dL 9.9  9.1  9.0    Lipid Panel     Component Value Date/Time   CHOL 307 (H) 12/11/2021 1645   TRIG 76 12/11/2021 1645   HDL 158 12/11/2021 1645   CHOLHDL 1.9 12/11/2021 1645   LDLCALC 138 (H) 12/11/2021 1645    CBC    Component Value Date/Time   WBC 7.4 11/22/2022 1421   WBC 6.2 11/14/2022 0458   RBC 3.73 (L) 11/22/2022 1421   RBC 3.81 (L) 11/14/2022 0458   HGB 12.4 (L) 11/22/2022 1421   HCT 35.5 (L) 11/22/2022 1421   PLT 507 (H) 11/22/2022 1421   MCV 95 11/22/2022  1421   MCH 33.2 (H) 11/22/2022 1421   MCH 33.6 11/14/2022 0458   MCHC 34.9 11/22/2022 1421   MCHC 34.7 11/14/2022 0458   RDW 12.8 11/22/2022 1421   LYMPHSABS 1.7 11/03/2022 0907   MONOABS 0.6 11/03/2022 0907   EOSABS 0.1 11/03/2022 0907   BASOSABS 0.1 11/03/2022 0907    ASSESSMENT AND PLAN: 1. Essential hypertension (Primary) .  Continue amlodipine  - amLODipine  (NORVASC ) 10 MG tablet; Take 1 tablet (10 mg total) by mouth daily.  Dispense: 90 tablet; Refill: 1 - CBC - Comprehensive metabolic panel with GFR - Lipid panel  2. Seizure disorder (HCC) Stable.  Continue Keppra . - levETIRAcetam  (KEPPRA ) 500 MG tablet; Take 1 tablet (500 mg total) by mouth 2 (two) times daily.  Dispense: 180 tablet; Refill: 1  3. Tobacco dependence Encouraged him to quit smoking.  He is not ready to give a trial of quitting.  Discussed health risks associated with smoking.  4. Poor appetite See #5 below.  Patient denies any depression this time.  5. Unintended weight loss Weight loss is not much change from when I saw him in August of last year.  However he has had some weight loss in the past 2 years when I look at his weight prior to the surgery that he had done last year.  We will check some baseline blood test.  Encouraged him to eat 3-5 smaller meals a day and purchase some Ensure or boost shakes to supplement meals.  Will bring him back in 4 to 6 weeks to reassess.  If he has had significant weight change, then we will order CT of chest abdomen and pelvis. - TSH+T4F+T3Free - HIV antibody (with reflex)  6.  Alcohol use disorder Commended him on cutting back but I do note that he was intoxicated at the time of ER visit last month.  Encouraged him to try to abstain from alcohol completely.  7. Need for immunization against influenza - Flu vaccine trivalent PF, 6mos and older(Flulaval,Afluria,Fluarix,Fluzone)  8. Pneumococcal vaccination declined Recommended but pt declined  9. Screening for colon  cancer - Ambulatory referral to Gastroenterology  10. Screening for HIV (human immunodeficiency virus) - HIV antibody (with reflex)    Patient was given the opportunity to ask questions.  Patient verbalized understanding of the plan and was able to repeat key elements of the plan.   This documentation was completed using Paediatric nurse.  Any transcriptional errors are unintentional.  Orders Placed This Encounter  Procedures   Flu vaccine trivalent PF, 6mos and older(Flulaval,Afluria,Fluarix,Fluzone)     Requested Prescriptions   Pending Prescriptions Disp Refills   amLODipine  (NORVASC ) 10 MG tablet 90 tablet 1    Sig: Take 1 tablet (10 mg total) by mouth daily.   gabapentin  (NEURONTIN ) 300 MG capsule 90 capsule     Sig: Take 1 capsule (300 mg total) by mouth at bedtime.   levETIRAcetam  (KEPPRA ) 500 MG tablet 180 tablet 1    Sig: Take 1 tablet (500 mg total) by mouth 2 (two) times daily.    No follow-ups on file.  Barnie Louder, MD, FACP

## 2024-01-31 NOTE — Patient Instructions (Signed)
  VISIT SUMMARY: Today, you came in for a follow-up visit to discuss your chronic medical conditions, including hypertension and seizure disorder. We also addressed your recent weight loss, poor appetite, and other health concerns.  YOUR PLAN: -UNINTENTIONAL WEIGHT LOSS AND POOR APPETITE: You have lost weight from 145 lbs to 131 lbs and have a poor appetite along with night sweats. We recommend eating 3 to 5 smaller meals daily and trying Ensure or Boost shakes. We have ordered tests to check your thyroid function, complete blood count, and liver and kidney function. We will follow up in 6 weeks to see if your weight stabilizes. If it does not, we will consider a CT scan of your chest, abdomen, and pelvis.  -ESSENTIAL HYPERTENSION: Your blood pressure is well-controlled with your current medication, amlodipine  10 mg daily. Continue taking this medication as prescribed. We have refilled your prescription.  -SEIZURE DISORDER: You have not had any seizures since your last visit, but you are not currently taking your medication, Keppra . We have refilled your prescription for Keppra  500 mg to be taken twice daily.  -PERIPHERAL NEUROPATHY, LEFT ARM AND LEG: You continue to experience numbness in your left arm and leg. You are taking gabapentin  for this condition. We have refilled your gabapentin  prescription.  -ALCOHOL USE DISORDER: You have reduced your alcohol consumption to one 16 oz can of beer per day. However, you recently had a fall while intoxicated. We encourage you to stop drinking alcohol completely.  -TOBACCO USE: You smoke five cigarettes per day, which increases your risk of heart attack, stroke, and cancer. We encourage you to quit smoking.  -DENTAL CARIES: You have decayed teeth and do not currently have a dental care provider. We advise you to contact Medicaid to find a local dentist and schedule an appointment.  -COLON POLYP WITH PARTIAL COLECTOMY: You had a colon polyp removed and a  partial colectomy in 2024. You are due for a repeat colonoscopy. We have submitted a referral for you to see Dr. Lorrayne for this procedure.  -GENERAL HEALTH MAINTENANCE: You received your flu vaccine today but declined the pneumonia vaccine. You are overdue for routine blood tests and have agreed to HIV and hepatitis C screening. We have ordered a complete blood count, cholesterol, liver and kidney function tests, as well as HIV and hepatitis C screening tests.  INSTRUCTIONS: Please follow up in 6 weeks to assess your weight stabilization. If your weight does not stabilize, we will consider further imaging tests. Additionally, please schedule your repeat colonoscopy with Dr. Lorrayne as soon as possible.                      Contains text generated by [employer].                                 Contains text generated by [employer].

## 2024-02-01 ENCOUNTER — Ambulatory Visit: Payer: Self-pay | Admitting: Internal Medicine

## 2024-02-01 DIAGNOSIS — R748 Abnormal levels of other serum enzymes: Secondary | ICD-10-CM

## 2024-02-01 LAB — HIV ANTIBODY (ROUTINE TESTING W REFLEX): HIV Screen 4th Generation wRfx: NONREACTIVE

## 2024-02-01 LAB — CBC
Hematocrit: 44.9 % (ref 37.5–51.0)
Hemoglobin: 15.2 g/dL (ref 13.0–17.7)
MCH: 32.8 pg (ref 26.6–33.0)
MCHC: 33.9 g/dL (ref 31.5–35.7)
MCV: 97 fL (ref 79–97)
Platelets: 207 x10E3/uL (ref 150–450)
RBC: 4.64 x10E6/uL (ref 4.14–5.80)
RDW: 13.3 % (ref 11.6–15.4)
WBC: 6.3 x10E3/uL (ref 3.4–10.8)

## 2024-02-01 LAB — COMPREHENSIVE METABOLIC PANEL WITH GFR
ALT: 77 IU/L — ABNORMAL HIGH (ref 0–44)
AST: 140 IU/L — ABNORMAL HIGH (ref 0–40)
Albumin: 5.2 g/dL — ABNORMAL HIGH (ref 4.1–5.1)
Alkaline Phosphatase: 131 IU/L — ABNORMAL HIGH (ref 47–123)
BUN/Creatinine Ratio: 15 (ref 9–20)
BUN: 23 mg/dL (ref 6–24)
Bilirubin Total: 1 mg/dL (ref 0.0–1.2)
CO2: 20 mmol/L (ref 20–29)
Calcium: 10.3 mg/dL — ABNORMAL HIGH (ref 8.7–10.2)
Chloride: 85 mmol/L — ABNORMAL LOW (ref 96–106)
Creatinine, Ser: 1.51 mg/dL — ABNORMAL HIGH (ref 0.76–1.27)
Globulin, Total: 2.8 g/dL (ref 1.5–4.5)
Glucose: 189 mg/dL — ABNORMAL HIGH (ref 70–99)
Potassium: 5 mmol/L (ref 3.5–5.2)
Sodium: 131 mmol/L — ABNORMAL LOW (ref 134–144)
Total Protein: 8 g/dL (ref 6.0–8.5)
eGFR: 56 mL/min/1.73 — ABNORMAL LOW (ref >59)

## 2024-02-01 LAB — LIPID PANEL
Chol/HDL Ratio: 2.3 ratio (ref 0.0–5.0)
Cholesterol, Total: 306 mg/dL — ABNORMAL HIGH (ref 100–199)
HDL: 136 mg/dL (ref >39)
LDL Chol Calc (NIH): 144 mg/dL — ABNORMAL HIGH (ref 0–99)
Triglycerides: 158 mg/dL — ABNORMAL HIGH (ref 0–149)
VLDL Cholesterol Cal: 26 mg/dL (ref 5–40)

## 2024-02-01 LAB — TSH+T4F+T3FREE
Free T4: 0.93 ng/dL (ref 0.82–1.77)
T3, Free: 1.8 pg/mL — ABNORMAL LOW (ref 2.0–4.4)
TSH: 3.5 u[IU]/mL (ref 0.450–4.500)

## 2024-02-01 NOTE — Addendum Note (Signed)
 Addended by: VICCI SOBER B on: 02/01/2024 08:27 AM   Modules accepted: Orders

## 2024-02-01 NOTE — Progress Notes (Signed)
-  BS elevated. May have DM. Have asked lab to add additional test to screen for DM. -Kidney function not 100% which is new. Try to drink 4-8 glasses water daily. Stop taking any OTC pain med like Ibuprofen , Aleve , Advil , Naprosyn , Motrin  which can make kidney function worse.  -Liver function test abnormal. Advise to stop drinking all alcoholic beverages to prevent further damage to the liver. Please return to lab to have additional studies done to screen for viral infections ASAP. -Sodium level is low. May be related to increase intake of alcoholic beverages. -Cholesterol levels elevated which increases risk for heart attacks/strokes. Healthy eating habits will help to lower. Let me know if he wants referral to nutritionist.  -Blood cell counts normal. -HIV screen negative.

## 2024-02-02 ENCOUNTER — Ambulatory Visit

## 2024-02-02 ENCOUNTER — Encounter: Payer: Self-pay | Admitting: Internal Medicine

## 2024-02-02 NOTE — Progress Notes (Signed)
 Patient came back to the office today to have blood test done.  I told him that we did not need to have additional blood test drawn as I was able to have the lab tech add the additional labs that were needed.  I went over the results of those additional test with him.  Screen for acute hepatitis A, B, and C negative.  Screen for diabetes revealed normal A1c. Advised patient that the elevated AST and ALT level suggest that he may be consuming more alcohol than he indicated.  Discussed damage that can be done to the liver from excessive alcohol use.  Encouraged him to cut back or better yet quit altogether.  Sodium level was slightly low.  GFR was 56 and was 71 a little over a year ago.  Advised to avoid NSAIDs and to stay hydrated by drinking 4 to 8 glasses of water daily.  Request that he return to the lab in about 3 weeks to have chem repeat. Lab appt given.

## 2024-02-03 LAB — ACUTE VIRAL HEPATITIS (HAV, HBV, HCV)
HCV Ab: NONREACTIVE
Hep A IgM: NEGATIVE
Hep B C IgM: NEGATIVE
Hepatitis B Surface Ag: NEGATIVE

## 2024-02-03 LAB — HCV INTERPRETATION

## 2024-02-03 LAB — SPECIMEN STATUS REPORT

## 2024-02-03 LAB — HGB A1C W/O EAG: Hgb A1c MFr Bld: 5.1 % (ref 4.8–5.6)

## 2024-02-23 ENCOUNTER — Ambulatory Visit: Attending: Family Medicine

## 2024-02-23 DIAGNOSIS — R748 Abnormal levels of other serum enzymes: Secondary | ICD-10-CM

## 2024-02-24 ENCOUNTER — Other Ambulatory Visit: Payer: Self-pay | Admitting: Internal Medicine

## 2024-02-24 ENCOUNTER — Ambulatory Visit: Payer: Self-pay | Admitting: *Deleted

## 2024-02-24 DIAGNOSIS — R748 Abnormal levels of other serum enzymes: Secondary | ICD-10-CM

## 2024-02-24 LAB — ACUTE HEP PANEL AND HEP B SURFACE AB
Hep A IgM: NEGATIVE
Hep B C IgM: NEGATIVE
Hep C Virus Ab: NONREACTIVE
Hepatitis B Surf Ab Quant: <3.5 m[IU]/mL — ABNORMAL LOW
Hepatitis B Surface Ag: NEGATIVE

## 2024-02-24 NOTE — Telephone Encounter (Signed)
Noted.  Agree with disposition.

## 2024-02-24 NOTE — Telephone Encounter (Signed)
 FYI Only or Action Required?: FYI only for provider: ED advised.  Patient was last seen in primary care on 01/31/2024 by Vicci Barnie NOVAK, MD.  Called Nurse Triage reporting Chest Pain.  Symptoms began several months ago.  Interventions attempted: Other: na .  Symptoms are: gradually worsening.  Triage Disposition: Go to ED Now (or PCP Triage)  Patient/caregiver understands and will follow disposition?: Yes   Patient calling PCP for medication request for muscle spasms. Due to sx reported in triage recommended evaluation in ED current chest pain reported now as moderate and constant.            Copied from CRM #[id number]. Topic: Clinical - Red Word Triage >> Feb 24, 2024 11:09 AM Ivette P wrote: [facility] that prompted transfer to Nurse Triage: muscle spasm - muscle relaxeers  2 months ago. right by your heart on the left side. and shoulder. Reason for Disposition  [1] Chest pain lasts > 5 minutes AND [2] occurred in past 3 days (72 hours) (Exception: Feels exactly the same as previously diagnosed heartburn and has accompanying sour taste in mouth.)  Answer Assessment - Initial Assessment Questions Recommended ED due to chest pain now and constant. Patient reports something doesn't feel right. Patient calling in to request medication for spasms. Patient did not report if he was drinking plenty of fluids. Recommended if sx worsening call 911. Please advise. Patient reports he has had sx x 2 months.       1. LOCATION: Where does it hurt?       Left side chest pain and radiates to left back side of shoulder 2. RADIATION: Does the pain go anywhere else? (e.g., into neck, jaw, arms, back)     Left shoulder pain  3. ONSET: When did the chest pain begin? (Minutes, hours or days)      2 months  4. PATTERN: Does the pain come and go, or has it been constant since it started?  Does it get worse with exertion?      Constant  5. DURATION: How long does it last  (e.g., seconds, minutes, hours)     Hurting now  6. SEVERITY: How bad is the pain?  (e.g., Scale 1-10; mild, moderate, or severe)     Moderate  7. CARDIAC RISK FACTORS: Do you have any history of heart problems or risk factors for heart disease? (e.g., angina, prior heart attack; diabetes, high blood pressure, high cholesterol, smoker, or strong family history of heart disease)     High cholesterol and BP  8. PULMONARY RISK FACTORS: Do you have any history of lung disease?  (e.g., blood clots in lung, asthma, emphysema, birth control pills)     na 9. CAUSE: What do you think is causing the chest pain?     Not sure  10. OTHER SYMPTOMS: Do you have any other symptoms? (e.g., dizziness, nausea, vomiting, sweating, fever, difficulty breathing, cough)       Last night broke out into sweat. No sweating now . Left side chest pain now, and left shoulder pain in back. Denies difficulty breathing. No sweating no vomiting, no dizziness now. Feels like something is wrong. 11. PREGNANCY: Is there any chance you are pregnant? When was your last menstrual period?       na  Protocols used: Chest Pain-A-AH

## 2024-02-25 ENCOUNTER — Ambulatory Visit: Payer: Self-pay | Admitting: Internal Medicine

## 2024-02-25 NOTE — Progress Notes (Signed)
 Liver function tests have normalized.  This is good.  Continue to avoid excessive alcohol use. Blood test show that he has not been vaccinated for hepatitis B virus.  I recommend getting the vaccine which is a 3 vaccine series given as initial dose then second dose 1 month later and third dose 6 months later.  If he would like to get the vaccine series, please give an appointment with the RN to get started with the series.

## 2024-02-27 NOTE — Telephone Encounter (Signed)
 Copied from CRM [id number]. Topic: Clinical - Lab/Test Results >> Feb 27, 2024 11:43 AM Berwyn MATSU wrote:  Reason for CRM: patient called in to go over lab test results on 02/23/24.  May you please assist.

## 2024-02-29 ENCOUNTER — Telehealth: Payer: Self-pay | Admitting: Internal Medicine

## 2024-02-29 LAB — HEPATIC FUNCTION PANEL
ALT: 11 IU/L (ref 0–44)
AST: 28 IU/L (ref 0–40)
Albumin: 4.6 g/dL (ref 4.1–5.1)
Alkaline Phosphatase: 100 IU/L (ref 47–123)
Bilirubin Total: 0.3 mg/dL (ref 0.0–1.2)
Bilirubin, Direct: 0.14 mg/dL (ref 0.00–0.40)
Total Protein: 7.3 g/dL (ref 6.0–8.5)

## 2024-02-29 LAB — SPECIMEN STATUS REPORT

## 2024-02-29 NOTE — Telephone Encounter (Signed)
 Patient's last Tdap was 05/07/2022, Please confirm if it is okay to schedule an appointment. Thank you.

## 2024-02-29 NOTE — Telephone Encounter (Signed)
 Thank you. Called but no answer. LVM to call back.

## 2024-02-29 NOTE — Telephone Encounter (Signed)
 Patient returned call back. Nurse visit scheduled for 11/17 Monday for TB test.

## 2024-02-29 NOTE — Telephone Encounter (Signed)
 Copied from CRM [id number]. Topic: Appointments - Appointment Scheduling >> Feb 29, 2024  2:24 PM Victoria B wrote:  Patient trying toi schedule appt for tb shot. Please cb

## 2024-03-05 ENCOUNTER — Ambulatory Visit

## 2024-03-08 ENCOUNTER — Ambulatory Visit: Admitting: Internal Medicine

## 2024-03-12 ENCOUNTER — Ambulatory Visit

## 2024-03-12 ENCOUNTER — Inpatient Hospital Stay: Admitting: Family Medicine

## 2024-03-19 ENCOUNTER — Ambulatory Visit

## 2024-03-20 ENCOUNTER — Ambulatory Visit

## 2024-03-21 ENCOUNTER — Ambulatory Visit: Attending: Internal Medicine

## 2024-03-21 DIAGNOSIS — Z111 Encounter for screening for respiratory tuberculosis: Secondary | ICD-10-CM

## 2024-03-21 NOTE — Progress Notes (Signed)
 PPD given in right forearm per  P&P

## 2024-03-23 ENCOUNTER — Ambulatory Visit

## 2024-03-28 ENCOUNTER — Ambulatory Visit: Payer: Self-pay

## 2024-03-28 NOTE — Telephone Encounter (Signed)
 This RN made second attempt to contact pt with no answer. A voicemail was left with call back number provided.

## 2024-03-28 NOTE — Telephone Encounter (Signed)
 Attempt # 1 to reach patient to triage symptoms. Left VM to call back    Copied from CRM #[id number]. Topic: Clinical - Red Word Triage >> Mar 28, 2024 12:04 PM Michael Oneill wrote: [facility] that prompted transfer to Nurse Triage: The patient is having discomfort that is radiating from their shoulder in to their chest and close to their heart, per the patient

## 2024-03-30 ENCOUNTER — Ambulatory Visit: Payer: Self-pay | Admitting: *Deleted

## 2024-04-02 ENCOUNTER — Ambulatory Visit

## 2024-04-23 ENCOUNTER — Ambulatory Visit: Payer: Self-pay | Attending: Family Medicine

## 2024-04-23 DIAGNOSIS — Z111 Encounter for screening for respiratory tuberculosis: Secondary | ICD-10-CM

## 2024-04-25 ENCOUNTER — Ambulatory Visit: Payer: Self-pay

## 2024-04-26 ENCOUNTER — Ambulatory Visit (HOSPITAL_BASED_OUTPATIENT_CLINIC_OR_DEPARTMENT_OTHER): Admitting: Internal Medicine

## 2024-04-26 DIAGNOSIS — Z5321 Procedure and treatment not carried out due to patient leaving prior to being seen by health care provider: Secondary | ICD-10-CM

## 2024-04-26 LAB — QUANTIFERON-TB GOLD PLUS
QuantiFERON Mitogen Value: >10 [IU]/mL
QuantiFERON Nil Value: 0.04 [IU]/mL
QuantiFERON TB1 Ag Value: 0.03 [IU]/mL
QuantiFERON TB2 Ag Value: 0.04 [IU]/mL
QuantiFERON-TB Gold Plus: NEGATIVE

## 2024-04-28 ENCOUNTER — Ambulatory Visit: Payer: Self-pay | Admitting: Internal Medicine

## 2024-04-30 ENCOUNTER — Ambulatory Visit: Admitting: Internal Medicine

## 2024-05-05 NOTE — Progress Notes (Signed)
 Left before being pulled from waiting room. Pt rescheduled.

## 2024-05-31 ENCOUNTER — Ambulatory Visit: Payer: Self-pay | Admitting: Internal Medicine
